# Patient Record
Sex: Female | Born: 2000 | Race: Black or African American | Hispanic: No | Marital: Single | Smoking: Never smoker
Health system: Southern US, Community
[De-identification: ages and names within clinical notes are randomized; demographics above are authoritative.]

## PROBLEM LIST (undated history)

## (undated) DIAGNOSIS — J45909 Unspecified asthma, uncomplicated: Secondary | ICD-10-CM

## (undated) DIAGNOSIS — R569 Unspecified convulsions: Secondary | ICD-10-CM

---

## 2020-07-03 ENCOUNTER — Ambulatory Visit
Admission: EM | Admit: 2020-07-03 | Discharge: 2020-07-03 | Disposition: A | Discharge status: Home / Self Care | Payer: Self-pay

## 2020-07-03 DIAGNOSIS — R0789 Other chest pain: Secondary | ICD-10-CM

## 2020-07-03 HISTORY — DX: Unspecified asthma, uncomplicated: J45.909

## 2020-07-03 MED ORDER — IBUPROFEN 800 MG PO TABS: 800.0000 mg | ORAL_TABLET | Freq: Three times a day (TID) | ORAL | 0 refills | Status: DC

## 2020-07-03 MED ORDER — ALBUTEROL SULFATE HFA 108 (90 BASE) MCG/ACT IN AERS
2.0000 | INHALATION_SPRAY | Freq: Once | RESPIRATORY_TRACT | Status: AC
  Administered 2020-07-03: 2 via RESPIRATORY_TRACT

## 2020-07-03 NOTE — ED Provider Notes (Signed)
[facility]    CSN: [id number] Arrival date & time: 07/03/20  1144      History   Chief Complaint Chief Complaint  Patient presents with  . Asthma    HPI Rebekah Leon is a 19 y.o. female.   19 year old female comes in for acute onset of right arm shaking and feeling of chest tightness/unable to catch her breath. States shaking has since resolved. Continues with chest tightness. Denies wheezing. Denies URI symptoms, fever. Does not have an inhaler     Past Medical History:  Diagnosis Date  . Asthma     There are no problems to display for this patient.   History reviewed. No pertinent surgical history.  OB History   No obstetric history on file.      Home Medications    Prior to Admission medications   Medication Sig Start Date End Date Taking? Authorizing Provider  albuterol (VENTOLIN HFA) 108 (90 Base) MCG/ACT inhaler Inhale into the lungs every 6 (six) hours as needed for wheezing or shortness of breath.   Yes [provider]  ibuprofen (ADVIL) 800 MG tablet Take 1 tablet (800 mg total) by mouth 3 (three) times daily. 07/03/20   Belinda Fisher, PA-C    Family History No family history on file.  Social History Social History   Tobacco Use  . Smoking status: Never Smoker  . Smokeless tobacco: Never Used  Substance Use Topics  . Alcohol use: Never  . Drug use: Never     Allergies   Patient has no known allergies.   Review of Systems Review of Systems  Reason unable to perform ROS: See HPI as above.     Physical Exam Triage Vital Signs ED Triage Vitals  Enc Vitals Group     BP 07/03/20 1157 (!) 143/95     Pulse Rate 07/03/20 1157 82     Resp 07/03/20 1157 18     Temp 07/03/20 1157 97.9 F (36.6 C)     Temp Source 07/03/20 1157 Oral     SpO2 07/03/20 1157 98 %     Weight --      Height --      Head Circumference --      Peak Flow --      Pain Score 07/03/20 1158 8     Pain Loc --      Pain Edu? --      Excl.  in GC? --    No data found.  Updated Vital Signs BP (!) 143/95 (BP Location: Left Arm)   Pulse 82   Temp 97.9 F (36.6 C) (Oral)   Resp 18   LMP 06/14/2020   SpO2 98%   Physical Exam Constitutional:      General: She is not in acute distress.    Appearance: Normal appearance. She is well-developed. She is not toxic-appearing or diaphoretic.  HENT:     Head: Normocephalic and atraumatic.  Eyes:     Conjunctiva/sclera: Conjunctivae normal.     Pupils: Pupils are equal, round, and reactive to light.  Cardiovascular:     Rate and Rhythm: Normal rate and regular rhythm.  Pulmonary:     Effort: Pulmonary effort is normal. No respiratory distress.     Comments: LCTAB Chest:     Comments: Chest tenderness to palpation.  Musculoskeletal:     Cervical back: Normal range of motion and neck supple.  Skin:    General: Skin is warm and dry.  Neurological:     Mental Status: She is alert and oriented to person, place, and time.    UC Treatments / Results  Labs (all labs ordered are listed, but only abnormal results are displayed) Labs Reviewed - No data to display  EKG   Radiology No results found.  Procedures Procedures (including critical care time)  Medications Ordered in UC Medications  albuterol (VENTOLIN HFA) 108 (90 Base) MCG/ACT inhaler 2 puff (2 puffs Inhalation Given 07/03/20 1214)    Initial Impression / Assessment and Plan / UC Course  I have reviewed the triage vital signs and the nursing notes.  Pertinent labs & imaging results that were available during my care of the patient were reviewed by me and considered in my medical decision making (see chart for details).    Chest wall tenderness to palpation. Discussed may not be asthma related. However, given without inhaler, will provide albuterol as needed. NSAIDs. Return precautions given.  Final Clinical Impressions(s) / UC Diagnoses   Final diagnoses:  Chest wall pain   ED Prescriptions     Medication Sig Dispense Auth. Provider   ibuprofen (ADVIL) 800 MG tablet Take 1 tablet (800 mg total) by mouth 3 (three) times daily. 21 tablet Belinda Fisher, PA-C     PDMP not reviewed this encounter.   Belinda Fisher, PA-C 07/03/20 1217

## 2020-07-03 NOTE — ED Triage Notes (Signed)
Pt states was at work and her rt arm started shaking and then she couldn't catch her breath. States didn't have her inhaler with her. Pt speaking in complete sentences, no distress noted.

## 2020-07-03 NOTE — Discharge Instructions (Signed)
Your lungs are clear without wheezing. Your chest wall is tender, which may be causing the symptoms instead of having an asthma attack. Ibuprofen as directed. Albuterol as needed. Otherwise, follow up for recheck if symptoms worsening. Follow up with PCP for further refills of inhaler if needed.

## 2020-08-23 ENCOUNTER — Ambulatory Visit
Admission: EM | Admit: 2020-08-23 | Discharge: 2020-08-23 | Disposition: A | Discharge status: Home / Self Care | Payer: Self-pay | Attending: Emergency Medicine | Admitting: Emergency Medicine

## 2020-08-23 ENCOUNTER — Other Ambulatory Visit: Payer: Self-pay

## 2020-08-23 DIAGNOSIS — R310 Gross hematuria: Secondary | ICD-10-CM

## 2020-08-23 DIAGNOSIS — M545 Low back pain, unspecified: Secondary | ICD-10-CM

## 2020-08-23 LAB — POCT URINALYSIS DIP (MANUAL ENTRY)
Bilirubin, UA: NEGATIVE
Glucose, UA: NEGATIVE mg/dL
Ketones, POC UA: NEGATIVE mg/dL
Leukocytes, UA: NEGATIVE
Nitrite, UA: NEGATIVE
Protein Ur, POC: 100 mg/dL — AB
Spec Grav, UA: >=1.03 — AB (ref 1.010–1.025)
Urobilinogen, UA: 0.2 E.U./dL
pH, UA: 6 (ref 5.0–8.0)

## 2020-08-23 LAB — POCT URINE PREGNANCY: Preg Test, Ur: NEGATIVE

## 2020-08-23 MED ORDER — CYCLOBENZAPRINE HCL 5 MG PO TABS
5.0000 mg | ORAL_TABLET | Freq: Two times a day (BID) | ORAL | 0 refills | Status: DC | PRN
Start: 2020-08-23 — End: 2020-11-07

## 2020-08-23 MED ORDER — TAMSULOSIN HCL 0.4 MG PO CAPS: 0.4000 mg | ORAL_CAPSULE | Freq: Every day | ORAL | 0 refills | Status: AC

## 2020-08-23 MED ORDER — NAPROXEN 500 MG PO TABS: 500.0000 mg | ORAL_TABLET | Freq: Two times a day (BID) | ORAL | 0 refills | Status: DC

## 2020-08-23 NOTE — ED Provider Notes (Addendum)
[facility]    CSN: [id number] Arrival date & time: 08/23/20  1121      History   Chief Complaint Chief Complaint  Patient presents with  . Flank Pain    Primarily left since monday    HPI Rebekah Leon is a 19 y.o. female history of asthma presenting today for evaluation of left flank pain.  Patient reports that symptoms will occasionally radiate to the right side.  She denies any urinary symptoms of dysuria frequency urgency, but has noticed some hematuria couple days ago.  Denies any injury or trauma to back.  Pain has been constant, but waxes and wanes in severity.  Denies associated nausea or vomiting.  Denies any history of kidney stones.  HPI  Past Medical History:  Diagnosis Date  . Asthma     There are no problems to display for this patient.   History reviewed. No pertinent surgical history.  OB History   No obstetric history on file.      Home Medications    Prior to Admission medications   Medication Sig Start Date End Date Taking? Authorizing Provider  albuterol (VENTOLIN HFA) 108 (90 Base) MCG/ACT inhaler Inhale into the lungs every 6 (six) hours as needed for wheezing or shortness of breath.    [provider]  cyclobenzaprine (FLEXERIL) 5 MG tablet Take 1-2 tablets (5-10 mg total) by mouth 2 (two) times daily as needed for muscle spasms. 08/23/20   Johnathan Heskett C, PA-C  ibuprofen (ADVIL) 800 MG tablet Take 1 tablet (800 mg total) by mouth 3 (three) times daily. 07/03/20   Cathie Hoops, Amy V, PA-C  naproxen (NAPROSYN) 500 MG tablet Take 1 tablet (500 mg total) by mouth 2 (two) times daily. 08/23/20   Ashon Rosenberg C, PA-C  tamsulosin (FLOMAX) 0.4 MG CAPS capsule Take 1 capsule (0.4 mg total) by mouth daily. 08/23/20   Ehab Humber, Junius Creamer, PA-C    Family History History reviewed. No pertinent family history.  Social History Social History   Tobacco Use  . Smoking status: Never Smoker  . Smokeless tobacco: Never Used  Vaping Use   . Vaping Use: Never used  Substance Use Topics  . Alcohol use: Never  . Drug use: Never     Allergies   Patient has no known allergies.   Review of Systems Review of Systems  Constitutional: Negative for fever.  Respiratory: Negative for shortness of breath.   Cardiovascular: Negative for chest pain.  Gastrointestinal: Negative for abdominal pain, diarrhea, nausea and vomiting.  Genitourinary: Positive for flank pain and hematuria. Negative for dysuria, genital sores, menstrual problem, vaginal bleeding, vaginal discharge and vaginal pain.  Musculoskeletal: Positive for back pain and myalgias.  Skin: Negative for rash.  Neurological: Negative for dizziness, light-headedness and headaches.     Physical Exam Triage Vital Signs ED Triage Vitals  Enc Vitals Group     BP 08/23/20 1155 (!) 135/93     Pulse Rate 08/23/20 1155 77     Resp 08/23/20 1155 18     Temp 08/23/20 1155 97.8 F (36.6 C)     Temp Source 08/23/20 1155 Oral     SpO2 08/23/20 1155 98 %     Weight --      Height --      Head Circumference --      Peak Flow --      Pain Score 08/23/20 1215 8     Pain Loc --      Pain Edu? --  Excl. in GC? --    No data found.  Updated Vital Signs BP (!) 135/93 (BP Location: Left Arm)   Pulse 77   Temp 97.8 F (36.6 C) (Oral)   Resp 18   LMP 08/11/2020   SpO2 98%   Visual Acuity Right Eye Distance:   Left Eye Distance:   Bilateral Distance:    Right Eye Near:   Left Eye Near:    Bilateral Near:     Physical Exam Vitals and nursing note reviewed.  Constitutional:      Appearance: She is well-developed and well-nourished.     Comments: No acute distress  HENT:     Head: Normocephalic and atraumatic.     Nose: Nose normal.  Eyes:     Conjunctiva/sclera: Conjunctivae normal.  Cardiovascular:     Rate and Rhythm: Normal rate.  Pulmonary:     Effort: Pulmonary effort is normal. No respiratory distress.     Comments: Breathing comfortably at rest,  CTABL, no wheezing, rales or other adventitious sounds auscultated Abdominal:     General: There is no distension.     Comments: Soft, nondistended, nontender light to palpation throughout abdomen  Musculoskeletal:        General: Normal range of motion.     Cervical back: Neck supple.     Comments: Back: Nontender to palpation of cervical thoracic and lumbar spine midline, diffuse tenderness throughout left lumbar and lower thoracic area extending to right flank/mid axillary line  Skin:    General: Skin is warm and dry.  Neurological:     Mental Status: She is alert and oriented to person, place, and time.  Psychiatric:        Mood and Affect: Mood and affect normal.      UC Treatments / Results  Labs (all labs ordered are listed, but only abnormal results are displayed) Labs Reviewed  POCT URINALYSIS DIP (MANUAL ENTRY) - Abnormal; Notable for the following components:      Result Value   Spec Grav, UA >=1.030 (*)    Blood, UA small (*)    Protein Ur, POC =100 (*)    All other components within normal limits  POCT URINE PREGNANCY    EKG   Radiology No results found.  Procedures Procedures (including critical care time)  Medications Ordered in UC Medications - No data to display  Initial Impression / Assessment and Plan / UC Course  I have reviewed the triage vital signs and the nursing notes.  Pertinent labs & imaging results that were available during my care of the patient were reviewed by me and considered in my medical decision making (see chart for details).     Negative pregnancy test, UA without leuks and nitrites, does have small amount of hemoglobin, unclear if this is related to being concentrated versus true hematuria.  Reported gross hematuria couple days ago, cannot completely rule out underlying stone contributing to symptoms.  Did have reproducible tenderness to touch, with this more suggestive of underlying MSK etiology of back pain.  No mechanism of  injury.  Recommending anti-inflammatories muscle relaxers, Flomax to help with any possible underlying stone with close monitoring of symptoms.  Discussed strict return precautions. Patient verbalized understanding and is agreeable with plan.  Final Clinical Impressions(s) / UC Diagnoses   Final diagnoses:  Acute left-sided low back pain without sciatica  Gross hematuria     Discharge Instructions     Naprosyn twice daily for back pain May supplement Flexeril  as needed at home and bedtime- this is a muscle relaxer Begin tamsulosin daily to help pass any possible underlying stone contributing to blood in urine Please follow-up if any symptoms not improving or worsening, if you are developing worsening pain with vomiting, fevers please go to emergency room for further evaluation    ED Prescriptions    Medication Sig Dispense Auth. Provider   naproxen (NAPROSYN) 500 MG tablet Take 1 tablet (500 mg total) by mouth 2 (two) times daily. 30 tablet Samer Dutton C, PA-C   cyclobenzaprine (FLEXERIL) 5 MG tablet Take 1-2 tablets (5-10 mg total) by mouth 2 (two) times daily as needed for muscle spasms. 24 tablet Herb Beltre C, PA-C   tamsulosin (FLOMAX) 0.4 MG CAPS capsule Take 1 capsule (0.4 mg total) by mouth daily. 7 capsule Aracelie Addis, [place] C, PA-C     PDMP not reviewed this encounter.   Sharyon Cable, [place] C, PA-C 08/23/20 1329    Hattie Pine, Ida C, PA-C 08/23/20 1332

## 2020-08-23 NOTE — ED Triage Notes (Signed)
Patient states she has had left flank pain primarily with occasional radiation to the right side. Pt states she has no burning when urinating or frequency/urgency but passed a large clot while urinating on Monday. Pt is aox4 and ambulatory.

## 2020-08-23 NOTE — Discharge Instructions (Signed)
Naprosyn twice daily for back pain May supplement Flexeril as needed at home and bedtime- this is a muscle relaxer Begin tamsulosin daily to help pass any possible underlying stone contributing to blood in urine Please follow-up if any symptoms not improving or worsening, if you are developing worsening pain with vomiting, fevers please go to emergency room for further evaluation

## 2020-11-07 ENCOUNTER — Ambulatory Visit
Admission: EM | Admit: 2020-11-07 | Discharge: 2020-11-07 | Disposition: A | Discharge status: Home / Self Care | Payer: Self-pay | Attending: Family Medicine | Admitting: Family Medicine

## 2020-11-07 ENCOUNTER — Other Ambulatory Visit: Payer: Self-pay

## 2020-11-07 DIAGNOSIS — R519 Headache, unspecified: Secondary | ICD-10-CM | POA: Insufficient documentation

## 2020-11-07 DIAGNOSIS — E860 Dehydration: Secondary | ICD-10-CM | POA: Insufficient documentation

## 2020-11-07 DIAGNOSIS — N3000 Acute cystitis without hematuria: Secondary | ICD-10-CM | POA: Insufficient documentation

## 2020-11-07 LAB — POCT URINALYSIS DIP (MANUAL ENTRY)
Bilirubin, UA: NEGATIVE
Blood, UA: NEGATIVE
Glucose, UA: NEGATIVE mg/dL
Nitrite, UA: NEGATIVE
Protein Ur, POC: 100 mg/dL — AB
Spec Grav, UA: >=1.03 — AB (ref 1.010–1.025)
Urobilinogen, UA: 0.2 E.U./dL
pH, UA: 7 (ref 5.0–8.0)

## 2020-11-07 LAB — POCT URINE PREGNANCY: Preg Test, Ur: NEGATIVE

## 2020-11-07 MED ORDER — NITROFURANTOIN MONOHYD MACRO 100 MG PO CAPS: 100.0000 mg | ORAL_CAPSULE | Freq: Two times a day (BID) | ORAL | 0 refills | Status: AC

## 2020-11-07 MED ORDER — NAPROXEN 500 MG PO TABS: 500.0000 mg | ORAL_TABLET | Freq: Two times a day (BID) | ORAL | 0 refills | Status: DC

## 2020-11-07 MED ORDER — CYCLOBENZAPRINE HCL 10 MG PO TABS: 10.0000 mg | ORAL_TABLET | Freq: Two times a day (BID) | ORAL | 0 refills | Status: AC | PRN

## 2020-11-07 NOTE — ED Provider Notes (Signed)
[facility]    CSN: [id number] Arrival date & time: 11/07/20  1601      History   Chief Complaint Chief Complaint  Patient presents with  . Headache    HPI Rebekah Leon is a 20 y.o. female.   HPI  Patient presents today with headaches and dizziness and weakness for over 2 weeks.  Patient endorses intermittent headaches.  She denies any nasal congestion or inner ear pressure.  She has not taken anything for headache related pain.  Endorses poor water intake.  She also endorses some left flank pain that has been intermittently for over a month.  She was diagnosed with a possible kidney stone back in December and reports pain has remained consistent since December she denies any visible hematuria.  Endorses some dysuria.Denies nausea, vomitus, or abdominal pain. Past Medical History:  Diagnosis Date  . Asthma     There are no problems to display for this patient.   History reviewed. No pertinent surgical history.  OB History   No obstetric history on file.      Home Medications    Prior to Admission medications   Medication Sig Start Date End Date Taking? Authorizing Provider  albuterol (VENTOLIN HFA) 108 (90 Base) MCG/ACT inhaler Inhale into the lungs every 6 (six) hours as needed for wheezing or shortness of breath.    [provider]  ibuprofen (ADVIL) 800 MG tablet Take 1 tablet (800 mg total) by mouth 3 (three) times daily. 07/03/20   Cathie Hoops, Amy V, PA-C  naproxen (NAPROSYN) 500 MG tablet Take 1 tablet (500 mg total) by mouth 2 (two) times daily. 08/23/20   Wieters, Hallie C, PA-C  tamsulosin (FLOMAX) 0.4 MG CAPS capsule Take 1 capsule (0.4 mg total) by mouth daily. 08/23/20   Wieters, Junius Creamer, PA-C    Family History History reviewed. No pertinent family history.  Social History Social History   Tobacco Use  . Smoking status: Never Smoker  . Smokeless tobacco: Never Used  Vaping Use  . Vaping Use: Never used  Substance Use Topics  .  Alcohol use: Never  . Drug use: Never     Allergies   Patient has no known allergies.   Review of Systems Review of Systems Pertinent negatives listed in HPI  Physical Exam Triage Vital Signs ED Triage Vitals  Enc Vitals Group     BP 11/07/20 1639 127/83     Pulse Rate 11/07/20 1639 78     Resp 11/07/20 1639 20     Temp 11/07/20 1639 98.9 F (37.2 C)     Temp Source 11/07/20 1639 Oral     SpO2 11/07/20 1639 98 %     Weight --      Height --      Head Circumference --      Peak Flow --      Pain Score 11/07/20 1648 9     Pain Loc --      Pain Edu? --      Excl. in GC? --    No data found.  Updated Vital Signs BP 127/83 (BP Location: Left Arm)   Pulse 78   Temp 98.9 F (37.2 C) (Oral)   Resp 20   LMP 10/15/2020   SpO2 98%   Visual Acuity Right Eye Distance:   Left Eye Distance:   Bilateral Distance:    Right Eye Near:   Left Eye Near:    Bilateral Near:     Physical Exam  General appearance: alert, well developed, well nourished, cooperative  Head: Normocephalic, without obvious abnormality, atraumatic Respiratory: Respirations even and unlabored, normal respiratory rate Heart: rate and rhythm normal. No gallop or murmurs noted on exam  Abdomen: BS +, no distention, no rebound tenderness, or no mass Extremities: No gross deformities Skin: Skin color, texture, turgor normal. No rashes seen  Psych: Appropriate mood and affect. Neurologic: GCS 15, normal gait, normal coordination  UC Treatments / Results  Labs (all labs ordered are listed, but only abnormal results are displayed) Labs Reviewed  POCT URINALYSIS DIP (MANUAL ENTRY) - Abnormal; Notable for the following components:      Result Value   Clarity, UA hazy (*)    Ketones, POC UA trace (5) (*)    Spec Grav, UA >=1.030 (*)    Protein Ur, POC =100 (*)    Leukocytes, UA Trace (*)    All other components within normal limits  URINE CULTURE  POCT URINE PREGNANCY    EKG   Radiology No  results found.  Procedures Procedures (including critical care time)  Medications Ordered in UC Medications - No data to display  Initial Impression / Assessment and Plan / UC Course  I have reviewed the triage vital signs and the nursing notes.  Pertinent labs & imaging results that were available during my care of the patient were reviewed by me and considered in my medical decision making (see chart for details).  Clinical Course as of 11/10/20 1015  Sat Nov 10, 2020  1013 Urine culture [KH]    Clinical Course User Index [KH] Bing Neighbors, FNP    Treating for acute cystitis based on UA findings and clinical symptoms with nitrofurantoin urine culture pending.  For acute ongoing flank pain and headache related pain trial cyclobenzaprine twice daily as needed along with naproxen 500 mg twice daily as needed.  Encouraged to hydrate well with water as this is likely resulting in dizziness and acute headache pain.  Return precautions given. Final Clinical Impressions(s) / UC Diagnoses   Final diagnoses:  Acute cystitis without hematuria  Mild dehydration  Generalized headache   Discharge Instructions   None    ED Prescriptions    Medication Sig Dispense Auth. Provider   cyclobenzaprine (FLEXERIL) 10 MG tablet Take 1 tablet (10 mg total) by mouth 2 (two) times daily as needed for muscle spasms. 20 tablet Bing Neighbors, FNP   nitrofurantoin, macrocrystal-monohydrate, (MACROBID) 100 MG capsule Take 1 capsule (100 mg total) by mouth 2 (two) times daily for 5 days. 10 capsule Bing Neighbors, FNP   naproxen (NAPROSYN) 500 MG tablet Take 1 tablet (500 mg total) by mouth 2 (two) times daily. 30 tablet Bing Neighbors, FNP     PDMP not reviewed this encounter.   Bing Neighbors, FNP 11/10/20 1015

## 2020-11-07 NOTE — ED Triage Notes (Signed)
Pt c/o headaches with dizziness and weakness for over 2wks. Hx of headaches. Pt c/o lt flank pain for months, states was tx'd for kidney stones but pain is still there.

## 2020-11-10 LAB — URINE CULTURE
Culture: <10000 — AB
Special Requests: NORMAL

## 2021-01-08 ENCOUNTER — Other Ambulatory Visit: Payer: Self-pay

## 2021-01-08 ENCOUNTER — Emergency Department (HOSPITAL_COMMUNITY): Payer: No Typology Code available for payment source

## 2021-01-08 ENCOUNTER — Encounter (HOSPITAL_COMMUNITY): Payer: Self-pay

## 2021-01-08 ENCOUNTER — Emergency Department (HOSPITAL_COMMUNITY)
Admission: EM | Admit: 2021-01-08 | Discharge: 2021-01-08 | Disposition: A | Discharge status: Home / Self Care | Payer: No Typology Code available for payment source | Attending: Emergency Medicine | Admitting: Emergency Medicine

## 2021-01-08 DIAGNOSIS — J45909 Unspecified asthma, uncomplicated: Secondary | ICD-10-CM | POA: Diagnosis not present

## 2021-01-08 DIAGNOSIS — M25511 Pain in right shoulder: Secondary | ICD-10-CM

## 2021-01-08 DIAGNOSIS — Y990 Civilian activity done for income or pay: Secondary | ICD-10-CM | POA: Insufficient documentation

## 2021-01-08 DIAGNOSIS — S4991XA Unspecified injury of right shoulder and upper arm, initial encounter: Secondary | ICD-10-CM | POA: Diagnosis present

## 2021-01-08 DIAGNOSIS — W19XXXA Unspecified fall, initial encounter: Secondary | ICD-10-CM | POA: Diagnosis not present

## 2021-01-08 NOTE — ED Notes (Signed)
Written discharge instructions given to the patient and reviewed

## 2021-01-08 NOTE — ED Provider Notes (Signed)
Emergency Medicine Provider Triage Evaluation Note  Rebekah Leon 20 y.o. female was evaluated in triage.  Pt complains of right shoulder pain. She was at work when a box fell and landed on her shoulder. Pain with movement. She reports some mild numbness/tingling to her hand.    Review of Systems  Positive: Shoulder pain, numbness.  Negative: Weakness.   Physical Exam  BP 134/82   Pulse 70   Temp 98.2 F (36.8 C) (Oral)   Resp 18   Ht 5\' 4"  (1.626 m)   Wt 65.8 kg   SpO2 100%   BMI 24.89 kg/m  Gen:   Awake, no distress   HEENT:  Atraumatic  Resp:  Normal effort  Cardiac:  Normal rate. 2+ radial pulses bilaterally.   MSK:   Moves extremities without difficulty. Right shoulder in sling. Tenderness noted to the right shoulder. No deformity or crepitus.  Skin:   Good distal cap refill. RUE is not dusky in appearance or cool to touch. Neuro:  Speech clear   Medical Decision Making  Medically screening exam initiated at 3:55 AM.  Appropriate orders placed.  Rebekah Leon was informed that the remainder of the evaluation will be completed by another provider, this initial triage assessment does not replace that evaluation, and the importance of remaining in the ED until their evaluation is complete.  Clinical Impression  Shoulder pain   Portions of this note were generated with Dragon dictation software. Dictation errors may occur despite best attempts at proofreading.     , PA-C 01/08/21 1333    03/10/21, MD 01/09/21 [phone]

## 2021-01-08 NOTE — ED Triage Notes (Signed)
Pt presents with c/o right shoulder injury after a box fell on her shoulder at work around 5:30 this morning.

## 2021-01-08 NOTE — ED Provider Notes (Signed)
[facility]-EMERGENCY DEPT Provider Note   CSN: [id number] Arrival date & time: 01/08/21  1319     History Chief Complaint  Patient presents with  . Shoulder Injury    Rebekah Leon is a 20 y.o. female.  Rebekah Hatton is left-handed.  She works at [employer].  A box fell on the top of her right shoulder, and she sustained pain.  Pain is worse with movement of the right arm.  The history is provided by the patient.  Shoulder Injury This is a new problem. The current episode started 12 to 24 hours ago. The problem occurs constantly. The problem has not changed since onset.Pertinent negatives include no chest pain, no abdominal pain, no headaches and no shortness of breath. Exacerbated by: movement of arm. The symptoms are relieved by rest. Treatments tried: sling. The treatment provided mild relief.       Past Medical History:  Diagnosis Date  . Asthma     There are no problems to display for this patient.   History reviewed. No pertinent surgical history.   OB History   No obstetric history on file.     History reviewed. No pertinent family history.  Social History   Tobacco Use  . Smoking status: Never Smoker  . Smokeless tobacco: Never Used  Vaping Use  . Vaping Use: Never used  Substance Use Topics  . Alcohol use: Never  . Drug use: Never    Home Medications Prior to Admission medications   Medication Sig Start Date End Date Taking? Authorizing Provider  albuterol (VENTOLIN HFA) 108 (90 Base) MCG/ACT inhaler Inhale into the lungs every 6 (six) hours as needed for wheezing or shortness of breath.    [provider]  cyclobenzaprine (FLEXERIL) 10 MG tablet Take 1 tablet (10 mg total) by mouth 2 (two) times daily as needed for muscle spasms. 11/07/20   Bing Neighbors, FNP  ibuprofen (ADVIL) 800 MG tablet Take 1 tablet (800 mg total) by mouth 3 (three) times daily. 07/03/20   Cathie Hoops, Amy V, PA-C  naproxen (NAPROSYN) 500 MG  tablet Take 1 tablet (500 mg total) by mouth 2 (two) times daily. 11/07/20   Bing Neighbors, FNP  tamsulosin (FLOMAX) 0.4 MG CAPS capsule Take 1 capsule (0.4 mg total) by mouth daily. 08/23/20   Wieters, Hallie C, PA-C    Allergies    Patient has no known allergies.  Review of Systems   Review of Systems  Constitutional: Negative for chills and fever.  HENT: Negative for ear pain and sore throat.   Eyes: Negative for pain and visual disturbance.  Respiratory: Negative for cough and shortness of breath.   Cardiovascular: Negative for chest pain and palpitations.  Gastrointestinal: Negative for abdominal pain and vomiting.  Genitourinary: Negative for dysuria and hematuria.  Musculoskeletal: Negative for arthralgias and back pain.  Skin: Negative for color change and rash.  Neurological: Negative for seizures, syncope and headaches.  All other systems reviewed and are negative.   Physical Exam Updated Vital Signs BP (!) 151/93 (BP Location: Left Arm)   Pulse 79   Temp 99.1 F (37.3 C) (Oral)   Resp 16   LMP 01/08/2021 (Approximate)   SpO2 99%   Physical Exam Vitals and nursing note reviewed.  HENT:     Head: Normocephalic and atraumatic.  Eyes:     General: No scleral icterus. Pulmonary:     Effort: Pulmonary effort is normal. No respiratory distress.  Musculoskeletal:  Cervical back: Normal range of motion.     Comments: The right shoulder is normal to inspection.  No tenderness over the clavicle or sternum.  There is mild, diffuse tenderness over the right shoulder.  Active range of motion is limited secondary to pain.  Passive range of motion is to 90 degrees forward flexion and abduction.  The arm is warm and well-perfused.  Skin:    General: Skin is warm and dry.  Neurological:     Mental Status: She is alert.  Psychiatric:        Mood and Affect: Mood normal.     ED Results / Procedures / Treatments   Labs (all labs ordered are listed, but only abnormal  results are displayed) Labs Reviewed - No data to display  EKG None  Radiology DG Shoulder Right  Result Date: 01/08/2021 CLINICAL DATA:  shoulder pain EXAM: RIGHT SHOULDER - 2+ VIEW COMPARISON:  None. FINDINGS: There is no evidence of fracture or dislocation. There is no evidence of arthropathy or other focal bone abnormality. Soft tissues are unremarkable. IMPRESSION: Negative right shoulder radiographs Electronically Signed   By: Caprice Renshaw   On: 01/08/2021 14:35    Procedures Procedures   Medications Ordered in ED Medications - No data to display  ED Course  I have reviewed the triage vital signs and the nursing notes.  Pertinent labs & imaging results that were available during my care of the patient were reviewed by me and considered in my medical decision making (see chart for details).    MDM Rules/Calculators/A&P                          Rebekah Rease likely sustained a shoulder contusion, subluxation, or soft tissue injury.  She can continue symptomatic treatment and she will follow-up with her Worker's Comp. provider.  She was given instructions on symptomatic management as well as a work note. Final Clinical Impression(s) / ED Diagnoses Final diagnoses:  Acute pain of right shoulder    Rx / DC Orders ED Discharge Orders    None       Koleen Distance, MD 01/08/21 [phone]

## 2021-01-10 ENCOUNTER — Emergency Department (HOSPITAL_COMMUNITY): Payer: BC Managed Care – PPO

## 2021-01-10 ENCOUNTER — Other Ambulatory Visit: Payer: Self-pay

## 2021-01-10 ENCOUNTER — Encounter (HOSPITAL_COMMUNITY): Payer: Self-pay

## 2021-01-10 ENCOUNTER — Emergency Department (HOSPITAL_COMMUNITY)
Admission: EM | Admit: 2021-01-10 | Discharge: 2021-01-10 | Disposition: A | Discharge status: Home / Self Care | Payer: BC Managed Care – PPO | Attending: Emergency Medicine | Admitting: Emergency Medicine

## 2021-01-10 DIAGNOSIS — R42 Dizziness and giddiness: Secondary | ICD-10-CM | POA: Insufficient documentation

## 2021-01-10 DIAGNOSIS — R55 Syncope and collapse: Secondary | ICD-10-CM | POA: Diagnosis not present

## 2021-01-10 DIAGNOSIS — R030 Elevated blood-pressure reading, without diagnosis of hypertension: Secondary | ICD-10-CM | POA: Diagnosis not present

## 2021-01-10 DIAGNOSIS — J45909 Unspecified asthma, uncomplicated: Secondary | ICD-10-CM | POA: Diagnosis not present

## 2021-01-10 DIAGNOSIS — R519 Headache, unspecified: Secondary | ICD-10-CM | POA: Diagnosis present

## 2021-01-10 DIAGNOSIS — H53149 Visual discomfort, unspecified: Secondary | ICD-10-CM | POA: Diagnosis not present

## 2021-01-10 DIAGNOSIS — R203 Hyperesthesia: Secondary | ICD-10-CM | POA: Insufficient documentation

## 2021-01-10 HISTORY — DX: Unspecified convulsions: R56.9

## 2021-01-10 LAB — CBC WITH DIFFERENTIAL/PLATELET
Abs Immature Granulocytes: NONE SEEN 10*3/uL (ref 0.00–0.07)
Band Neutrophils: 0 %
Basophils Relative: 0 %
Blasts: NONE SEEN %
Eosinophils Relative: 1 %
HCT: 43.4 % (ref 36.0–46.0)
Hemoglobin: 13.3 g/dL (ref 12.0–15.0)
Immature Granulocytes: NONE SEEN %
Lymphocytes Relative: 13 %
MCH: 26.8 pg (ref 26.0–34.0)
MCHC: 30.6 g/dL (ref 30.0–36.0)
MCV: 87.5 fL (ref 80.0–100.0)
Metamyelocytes Relative: NONE SEEN %
Monocytes Relative: 1 %
Myelocytes: NONE SEEN %
Neutrophils Relative %: 85 %
Platelets: 298 10*3/uL (ref 150–400)
Promyelocytes Relative: NONE SEEN %
RBC Morphology: NORMAL
RBC: 4.96 MIL/uL (ref 3.87–5.11)
RDW: 14 % (ref 11.5–15.5)
WBC Morphology: REACTIVE
WBC: 12 10*3/uL — ABNORMAL HIGH (ref 4.0–10.5)
nRBC: 0 % (ref 0.0–0.2)
nRBC: NONE SEEN /100 WBC

## 2021-01-10 LAB — BASIC METABOLIC PANEL
Anion gap: 7 (ref 5–15)
BUN: 11 mg/dL (ref 6–20)
CO2: 25 mmol/L (ref 22–32)
Calcium: 9.4 mg/dL (ref 8.9–10.3)
Chloride: 105 mmol/L (ref 98–111)
Creatinine, Ser: 0.9 mg/dL (ref 0.44–1.00)
GFR, Estimated: >60 mL/min (ref >60)
Glucose, Bld: 100 mg/dL — ABNORMAL HIGH (ref 70–99)
Potassium: 4 mmol/L (ref 3.5–5.1)
Sodium: 137 mmol/L (ref 135–145)

## 2021-01-10 LAB — I-STAT BETA HCG BLOOD, ED (MC, WL, AP ONLY): I-stat hCG, quantitative: <5 m[IU]/mL (ref <5)

## 2021-01-10 LAB — CBG MONITORING, ED
Glucose-Capillary: 93 mg/dL (ref 70–99)
Glucose-Capillary: 71 mg/dL (ref 70–99)

## 2021-01-10 LAB — RAPID URINE DRUG SCREEN, HOSP PERFORMED
Amphetamines: NOT DETECTED
Barbiturates: NOT DETECTED
Benzodiazepines: NOT DETECTED
Cocaine: NOT DETECTED
Opiates: NOT DETECTED
Tetrahydrocannabinol: NOT DETECTED

## 2021-01-10 MED ORDER — IOHEXOL 350 MG/ML SOLN
80.0000 mL | Freq: Once | INTRAVENOUS | Status: AC | PRN
  Administered 2021-01-10: 80 mL via INTRAVENOUS

## 2021-01-10 MED ORDER — KETOROLAC TROMETHAMINE 30 MG/ML IJ SOLN
15.0000 mg | Freq: Once | INTRAMUSCULAR | Status: AC
  Administered 2021-01-10: 15 mg via INTRAVENOUS
  Filled 2021-01-10: qty 1

## 2021-01-10 MED ORDER — METOCLOPRAMIDE HCL 5 MG/ML IJ SOLN
5.0000 mg | Freq: Once | INTRAMUSCULAR | Status: AC
  Administered 2021-01-10: 5 mg via INTRAVENOUS
  Filled 2021-01-10: qty 2

## 2021-01-10 MED ORDER — SODIUM CHLORIDE 0.9 % IV BOLUS
1000.0000 mL | Freq: Once | INTRAVENOUS | Status: AC
  Administered 2021-01-10: 1000 mL via INTRAVENOUS

## 2021-01-10 MED ORDER — DIPHENHYDRAMINE HCL 50 MG/ML IJ SOLN: 50.0000 mg | Freq: Once | INTRAMUSCULAR | Status: DC

## 2021-01-10 MED ORDER — NAPROXEN 375 MG PO TABS: 375.0000 mg | ORAL_TABLET | Freq: Two times a day (BID) | ORAL | 0 refills | Status: DC

## 2021-01-10 MED ORDER — MECLIZINE HCL 25 MG PO TABS: 25.0000 mg | ORAL_TABLET | Freq: Three times a day (TID) | ORAL | 0 refills | Status: AC | PRN

## 2021-01-10 MED ORDER — DIPHENHYDRAMINE HCL 50 MG/ML IJ SOLN
25.0000 mg | Freq: Once | INTRAMUSCULAR | Status: AC
  Administered 2021-01-10: 25 mg via INTRAVENOUS
  Filled 2021-01-10: qty 1

## 2021-01-10 MED ORDER — METOCLOPRAMIDE HCL 5 MG/ML IJ SOLN: 10.0000 mg | Freq: Once | INTRAMUSCULAR | Status: DC

## 2021-01-10 NOTE — ED Notes (Signed)
Lavender top tube sent to lab. ?

## 2021-01-10 NOTE — ED Notes (Signed)
Pt attempted to do orthostatic vitals only was able to complete lying and sitting couldn't do the standing pt felt to weak to continue

## 2021-01-10 NOTE — ED Provider Notes (Signed)
Emergency Medicine Provider Triage Evaluation Note  Rebekah Leon , a 20 y.o. female  was evaluated in triage.  Pt complains of feeling dizzy and falling.  Occurred one hour prior.  Patient denies any syncope. Dizziness is worse with standing.    Patient also complains of headache, reports that headache started prior to her fall.  Pain has gotten progressively worse.  Headache is worse with light and sound. Has a history of migraine headaches.    Review of Systems  Positive: Headaches, dizziness, photophobia  Negative: Fever, chills, facial asymmetry, slurred speech, shob, chest pain, neck pain, back pain, syncope, saddle anesthesia, bowel or bladder dysfuction  Physical Exam  BP (!) 143/103 (BP Location: Left Arm)   Pulse 86   Temp 98.8 F (37.1 C) (Oral)   Resp 18   Ht 5\' 7"  (1.702 m)   Wt 104.3 kg   LMP 01/08/2021 (Approximate)   SpO2 98%   BMI 36.02 kg/m  Gen:   Awake, no distress   Resp:  Normal effort  MSK:   Moves extremities without difficulty  Other:  No facial asymmetry, strength equal  Medical Decision Making  Medically screening exam initiated at 2:28 PM.  Appropriate orders placed.  Rebekah Leon was informed that the remainder of the evaluation will be completed by another provider, this initial triage assessment does not replace that evaluation, and the importance of remaining in the ED until their evaluation is complete.  The patient appears stable so that the remainder of the work up may be completed by another provider.      03/10/2021, PA-C 01/10/21 1433    03/12/21, MD 01/12/21 [phone]

## 2021-01-10 NOTE — ED Triage Notes (Addendum)
Per EMS- Patient was walking from her bedroom to the mother's bedroom, felt dizzy and fell. Mom reports that the patient has not have any food or drink since yesterday. Mom also reported that the patient may have taken drugs.   Patient states she did not pass out.

## 2021-01-10 NOTE — ED Provider Notes (Signed)
[facility]-EMERGENCY DEPT Provider Note   CSN: [id number] Arrival date & time: 01/10/21  1348     History Chief Complaint  Patient presents with  . Fall  . Dizziness  . Headache    Rebekah Leon is a 20 y.o. female who presents emergency department with chief complaint of headache and near syncope.  Patient states that at 12:30 PM today she had sudden onset of severe frontal headache, the worst headache she is ever had reaching peak capacity within the first 10 minutes.  She got very dizzy and felt like she was going to pass out and called for her mother.  She complains of hyperesthesia on the left side of her body and has associated photophobia and phonophobia without nausea or vomiting.  She denies racing or skipping in her heart.  She did not drink alcohol last night.  She denies any drug use.  She denies room spinning dizziness.  HPI     Past Medical History:  Diagnosis Date  . Asthma   . Seizures (HCC)     There are no problems to display for this patient.   History reviewed. No pertinent surgical history.   OB History   No obstetric history on file.     Family History  Family history unknown: Yes    Social History   Tobacco Use  . Smoking status: Never Smoker  . Smokeless tobacco: Never Used  Vaping Use  . Vaping Use: Never used  Substance Use Topics  . Alcohol use: Never  . Drug use: Never    Home Medications Prior to Admission medications   Medication Sig Start Date End Date Taking? Authorizing Provider  albuterol (VENTOLIN HFA) 108 (90 Base) MCG/ACT inhaler Inhale into the lungs every 6 (six) hours as needed for wheezing or shortness of breath.    [provider]  cyclobenzaprine (FLEXERIL) 10 MG tablet Take 1 tablet (10 mg total) by mouth 2 (two) times daily as needed for muscle spasms. 11/07/20   Bing Neighbors, FNP  ibuprofen (ADVIL) 800 MG tablet Take 1 tablet (800 mg total) by mouth 3 (three) times daily.  07/03/20   Cathie Hoops, Amy V, PA-C  naproxen (NAPROSYN) 500 MG tablet Take 1 tablet (500 mg total) by mouth 2 (two) times daily. 11/07/20   Bing Neighbors, FNP  tamsulosin (FLOMAX) 0.4 MG CAPS capsule Take 1 capsule (0.4 mg total) by mouth daily. 08/23/20   Wieters, Hallie C, PA-C    Allergies    Cheese  Review of Systems   Review of Systems Ten systems reviewed and are negative for acute change, except as noted in the HPI.   Physical Exam Updated Vital Signs BP (!) 138/101   Pulse 68   Temp 98.8 F (37.1 C) (Oral)   Resp 18   Ht 5\' 7"  (1.702 m)   Wt 104.3 kg   LMP 01/08/2021 (Approximate)   SpO2 100%   BMI 36.02 kg/m   Physical Exam Vitals and nursing note reviewed.  Constitutional:      General: She is not in acute distress.    Appearance: She is well-developed. She is not diaphoretic.  HENT:     Head: Normocephalic and atraumatic.  Eyes:     General: No scleral icterus.    Conjunctiva/sclera: Conjunctivae normal.  Cardiovascular:     Rate and Rhythm: Normal rate and regular rhythm.     Heart sounds: Normal heart sounds. No murmur heard. No friction rub. No gallop.  Pulmonary:     Effort: Pulmonary effort is normal. No respiratory distress.     Breath sounds: Normal breath sounds.  Abdominal:     General: Bowel sounds are normal. There is no distension.     Palpations: Abdomen is soft. There is no mass.     Tenderness: There is no abdominal tenderness. There is no guarding.  Musculoskeletal:     Cervical back: Normal range of motion.  Skin:    General: Skin is warm and dry.  Neurological:     Mental Status: She is alert and oriented to person, place, and time.  Psychiatric:        Behavior: Behavior normal.     ED Results / Procedures / Treatments   Labs (all labs ordered are listed, but only abnormal results are displayed) Labs Reviewed  BASIC METABOLIC PANEL - Abnormal; Notable for the following components:      Result Value   Glucose, Bld 100 (*)     All other components within normal limits  CBC WITH DIFFERENTIAL/PLATELET - Abnormal; Notable for the following components:   WBC 12.0 (*)    All other components within normal limits  RAPID URINE DRUG SCREEN, HOSP PERFORMED  CBG MONITORING, ED  I-STAT BETA HCG BLOOD, ED ([facility], [facility], [facility] ONLY)  CBG MONITORING, ED    EKG EKG Interpretation  Date/Time:  Thursday Jan 10 2021 14:44:54 EDT Ventricular Rate:  83 PR Interval:  177 QRS Duration: 72 QT Interval:  347 QTC Calculation: 408 R Axis:   55 Text Interpretation: Sinus rhythm since last tracing no significant change Confirmed by Mancel Bale [id number]) on 01/10/2021 4:09:30 PM   Radiology No results found.  Procedures Procedures   Medications Ordered in ED Medications  sodium chloride 0.9 % bolus 1,000 mL (has no administration in time range)  ketorolac (TORADOL) 30 MG/ML injection 15 mg (has no administration in time range)  diphenhydrAMINE (BENADRYL) injection 25 mg (has no administration in time range)  metoCLOPramide (REGLAN) injection 5 mg (has no administration in time range)    ED Course  I have reviewed the triage vital signs and the nursing notes.  Pertinent labs & imaging results that were available during my care of the patient were reviewed by me and considered in my medical decision making (see chart for details).  Clinical Course as of 01/10/21 2236  Thu Jan 10, 2021  1834 WBC(!): 12.0 [AH]    Clinical Course User Index [AH] Arthor Captain, PA-C   MDM Rules/Calculators/A&P                            Rebekah Leon presents with headache Given the large differential diagnosis for [facility], the decision making in this case is of high complexity.  I ordered and reviewed labs that include a CBC which shows mildly elevated white blood cell count, BMP with slightly elevated blood glucose, UDS negative, CBG within normal limits.  I-STAT hCG negative. I ordered and reviewed a CT angiogram of the  head which shows no acute abnormalities, EKG normal sinus rhythm at a rate of 83. Patient treated for migraine headache with Benadryl, Toradol, Reglan, fluids with improvement in her headache.  She has negative orthostatic vital signs. Patient blood pressure elevated throughout her visit here.  She is advised to follow closely with her primary care physician for her hypertension and headaches.  Patient ambulatory without assistance to the bathroom and back.  After evaluating all  of the data points in this case, the presentation of Rebekah Leon is NOT consistent with skull fracture, meningitis/encephalitis, SAH/sentinel bleed, Intracranial Hemorrhage (ICH) (subdural/epidural), acute obstructive hydrocephalus, space occupying lesions, CVA, CO Poisoning, Basilar/vertebral artery dissection, preeclampsia, cerebral venous thrombosis, hypertensive emergency, temporal Arteritis, Idiopathic Intracranial Hypertension (pseudotumor cerebri).  Strict return and follow-up precautions have been given by me personally or by detailed written instructions verbalized by nursing staff using the teach back method to patient/family/caregiver.  Data Reviewed/Counseling: I have reviewed the patient's vital signs, nursing notes, and other relevant tests/information. I had a detailed discussion regarding the historical points, exam findings, and any diagnostic results supporting the discharge diagnosis. I also discussed the need for outpatient follow-up and the need to return to the ED if symptoms worsen or if there are any questions or concerns that arise at hom   Final Clinical Impression(s) / ED Diagnoses Final diagnoses:  Bad headache  Near syncope  Elevated blood pressure reading    Rx / DC Orders ED Discharge Orders    None       Arthor Captain, PA-C 01/10/21 2323    Virgina Norfolk, DO 01/10/21 2326

## 2021-01-10 NOTE — Discharge Instructions (Signed)

## 2021-01-11 ENCOUNTER — Encounter (HOSPITAL_COMMUNITY): Payer: Self-pay

## 2021-01-11 ENCOUNTER — Emergency Department (HOSPITAL_COMMUNITY)
Admission: EM | Admit: 2021-01-11 | Discharge: 2021-01-11 | Disposition: A | Discharge status: Home / Self Care | Payer: BC Managed Care – PPO | Attending: Emergency Medicine | Admitting: Emergency Medicine

## 2021-01-11 DIAGNOSIS — R519 Headache, unspecified: Secondary | ICD-10-CM | POA: Insufficient documentation

## 2021-01-11 DIAGNOSIS — R55 Syncope and collapse: Secondary | ICD-10-CM | POA: Insufficient documentation

## 2021-01-11 DIAGNOSIS — J45909 Unspecified asthma, uncomplicated: Secondary | ICD-10-CM | POA: Diagnosis not present

## 2021-01-11 DIAGNOSIS — R531 Weakness: Secondary | ICD-10-CM | POA: Diagnosis not present

## 2021-01-11 LAB — CBC WITH DIFFERENTIAL/PLATELET
Abs Immature Granulocytes: 0.04 10*3/uL (ref 0.00–0.07)
Basophils Absolute: 0 10*3/uL (ref 0.0–0.1)
Basophils Relative: 0 %
Eosinophils Absolute: 0.1 10*3/uL (ref 0.0–0.5)
Eosinophils Relative: 1 %
HCT: 43.6 % (ref 36.0–46.0)
Hemoglobin: 13.5 g/dL (ref 12.0–15.0)
Immature Granulocytes: 0 %
Lymphocytes Relative: 23 %
Lymphs Abs: 2.4 10*3/uL (ref 0.7–4.0)
MCH: 27.2 pg (ref 26.0–34.0)
MCHC: 31 g/dL (ref 30.0–36.0)
MCV: 87.7 fL (ref 80.0–100.0)
Monocytes Absolute: 0.6 10*3/uL (ref 0.1–1.0)
Monocytes Relative: 6 %
Neutro Abs: 7 10*3/uL (ref 1.7–7.7)
Neutrophils Relative %: 70 %
Platelets: 298 10*3/uL (ref 150–400)
RBC: 4.97 MIL/uL (ref 3.87–5.11)
RDW: 14 % (ref 11.5–15.5)
WBC: 10.2 10*3/uL (ref 4.0–10.5)
nRBC: 0 % (ref 0.0–0.2)

## 2021-01-11 LAB — I-STAT CHEM 8, ED
BUN: 12 mg/dL (ref 6–20)
Calcium, Ion: 1.29 mmol/L (ref 1.15–1.40)
Chloride: 103 mmol/L (ref 98–111)
Creatinine, Ser: 0.9 mg/dL (ref 0.44–1.00)
Glucose, Bld: 93 mg/dL (ref 70–99)
HCT: 44 % (ref 36.0–46.0)
Hemoglobin: 15 g/dL (ref 12.0–15.0)
Potassium: 4.3 mmol/L (ref 3.5–5.1)
Sodium: 140 mmol/L (ref 135–145)
TCO2: 27 mmol/L (ref 22–32)

## 2021-01-11 LAB — CBG MONITORING, ED: Glucose-Capillary: 92 mg/dL (ref 70–99)

## 2021-01-11 MED ORDER — NAPROXEN 375 MG PO TABS
375.0000 mg | ORAL_TABLET | Freq: Once | ORAL | Status: AC
  Administered 2021-01-11: 375 mg via ORAL
  Filled 2021-01-11: qty 1

## 2021-01-11 NOTE — ED Provider Notes (Signed)
[facility] EMERGENCY DEPARTMENT Provider Note  CSN: [id number] Arrival date & time: 01/11/21 1144    History Chief Complaint  Patient presents with  . Weakness  . Headache    HPI  Rebekah Leon is a 20 y.o. female presents for re-evaluation of moderate frontal headache. She was seen yesterday for same along with an episode of near syncope and generalized weakness. She had an extensive workup including labs, CTA and given Troadol, Reglan, Benadryl with good relief. She was given Rx for Naprosyn and Meclizine but it was too late for her to pick up her Rx last night. She came back today because her headache returned, still feeling generally weak. No fever, neck pain or other new symptoms. She returned because her discharge instructions told her to come back if she wasn't doing better. She has a history of seizures as a child, but had not been taking any medications recently and no established neurologist.    Past Medical History:  Diagnosis Date  . Asthma   . Seizures (HCC)     History reviewed. No pertinent surgical history.  Family History  Family history unknown: Yes    Social History   Tobacco Use  . Smoking status: Never Smoker  . Smokeless tobacco: Never Used  Vaping Use  . Vaping Use: Never used  Substance Use Topics  . Alcohol use: Never  . Drug use: Never     Home Medications Prior to Admission medications   Medication Sig Start Date End Date Taking? Authorizing Provider  albuterol (VENTOLIN HFA) 108 (90 Base) MCG/ACT inhaler Inhale into the lungs every 6 (six) hours as needed for wheezing or shortness of breath.    [provider]  cyclobenzaprine (FLEXERIL) 10 MG tablet Take 1 tablet (10 mg total) by mouth 2 (two) times daily as needed for muscle spasms. 11/07/20   Bing Neighbors, FNP  meclizine (ANTIVERT) 25 MG tablet Take 1 tablet (25 mg total) by mouth 3 (three) times daily as needed for dizziness. 01/10/21   Arthor Captain, PA-C   naproxen (NAPROSYN) 375 MG tablet Take 1 tablet (375 mg total) by mouth 2 (two) times daily with a meal. 01/10/21   Arthor Captain, PA-C  tamsulosin (FLOMAX) 0.4 MG CAPS capsule Take 1 capsule (0.4 mg total) by mouth daily. 08/23/20   Wieters, Hallie C, PA-C     Allergies    Cheese   Review of Systems   Review of Systems A comprehensive review of systems was completed and negative except as noted in HPI.    Physical Exam BP (!) 155/108 (BP Location: Left Arm)   Pulse 70   Temp 99 F (37.2 C) (Oral)   Resp 15   LMP 01/08/2021 (Approximate)   SpO2 100%   Physical Exam Vitals and nursing note reviewed.  Constitutional:      Appearance: Normal appearance.  HENT:     Head: Normocephalic and atraumatic.     Nose: Nose normal.     Mouth/Throat:     Mouth: Mucous membranes are moist.  Eyes:     Extraocular Movements: Extraocular movements intact.     Conjunctiva/sclera: Conjunctivae normal.  Cardiovascular:     Rate and Rhythm: Normal rate.  Pulmonary:     Effort: Pulmonary effort is normal.     Breath sounds: Normal breath sounds.  Abdominal:     General: Abdomen is flat.     Palpations: Abdomen is soft.     Tenderness: There is no abdominal tenderness.  Musculoskeletal:        General: No swelling. Normal range of motion.     Cervical back: Neck supple.     Comments: R arm in sling from prior shoulder injury earlier this week  Skin:    General: Skin is warm and dry.  Neurological:     General: No focal deficit present.     Mental Status: She is alert and oriented to person, place, and time. Mental status is at baseline.     Cranial Nerves: No cranial nerve deficit.     Sensory: No sensory deficit.     Motor: No weakness.  Psychiatric:        Mood and Affect: Mood normal.      ED Results / Procedures / Treatments   Labs (all labs ordered are listed, but only abnormal results are displayed) Labs Reviewed  CBC WITH DIFFERENTIAL/PLATELET  I-STAT CHEM 8, ED   CBG MONITORING, ED    EKG None  Radiology CT Angio Head W or Wo Contrast  Result Date: 01/10/2021 CLINICAL DATA:  Dizziness, headache EXAM: CT ANGIOGRAPHY HEAD TECHNIQUE: Multidetector CT imaging of the head was performed using the standard protocol during bolus administration of intravenous contrast. Multiplanar CT image reconstructions and MIPs were obtained to evaluate the vascular anatomy. CONTRAST:  80mL OMNIPAQUE IOHEXOL 350 MG/ML SOLN COMPARISON:  None FINDINGS: CT HEAD Brain: There is no acute intracranial hemorrhage, mass effect, or edema. Gray-white differentiation is preserved. Ventricles and sulci are normal in size and configuration. No extra-axial collection. Vascular: No hyperdense vessel or unexpected calcification. Skull: Unremarkable. Sinuses: No acute abnormality. Other: Mastoid air cells are clear. CTA HEAD Anterior circulation: Intracranial internal carotid arteries are patent. Anterior and middle cerebral arteries are patent. Posterior circulation: Intracranial vertebral arteries, basilar artery, and posterior cerebral arteries are patent. Bilateral posterior communicating arteries are present. Venous sinuses: As permitted by contrast timing, patent. Review of the MIP images confirms the above findings. IMPRESSION: No acute intracranial abnormality. No proximal intracranial vessel occlusion. No aneurysm. Electronically Signed   By: Guadlupe Spanish M.D.   On: 01/10/2021 18:42    Procedures Procedures  Medications Ordered in the ED Medications  naproxen (NAPROSYN) tablet 375 mg (has no administration in time range)     MDM Rules/Calculators/A&P MDM Results from CT yesterday and repeat labs today are unremarkable and in fact her mild leukocytosis from yesterday has resolved. Offered IM Toradol as this helped her symptoms yesterday but she declined and requested oral meds instead. Advised to follow up with PCP for further management and/or referral to Neurology if her  symptoms persist.  ED Course  I have reviewed the triage vital signs and the nursing notes.  Pertinent labs & imaging results that were available during my care of the patient were reviewed by me and considered in my medical decision making (see chart for details).     Final Clinical Impression(s) / ED Diagnoses Final diagnoses:  Acute nonintractable headache, unspecified headache type  Generalized weakness    Rx / DC Orders ED Discharge Orders    None       Pollyann Savoy, MD 01/11/21 1627

## 2021-01-11 NOTE — ED Triage Notes (Signed)
Pt arrived via POV, c/o headache and generalized weakness. States earlier, felt her entire body go numb, went away and felt weakness throughout. States she was seen yesterday for same. No relief. Sent home with rx, not able to take yet. No focal deficits.

## 2021-01-11 NOTE — Discharge Instructions (Addendum)
Please take the pain medications as needed. Drink plenty of fluids and rest over the weekend. Follow up with your primary doctor if your symptoms persist as you may need to be referred to a neurologist

## 2021-01-11 NOTE — ED Provider Notes (Signed)
Emergency Medicine Provider Triage Evaluation Note  Rebekah Leon , a 20 y.o. female  was evaluated in triage.  Pt complains of weakness.  Review of Systems  Positive: Headache, dizzy, lightheadedness Negative: Fever, cold sxs, injury  Physical Exam  BP (!) 157/96 (BP Location: Left Arm)   Pulse 84   Temp 99 F (37.2 C) (Oral)   Resp 18   LMP 01/08/2021 (Approximate)   SpO2 98%  Gen:   Awake, no distress   Resp:  Normal effort  MSK:   Wear sling to R arm.  Otherwise moves other extremities without difficulty Other:    Medical Decision Making  Medically screening exam initiated at 12:05 PM.  Appropriate orders placed.  Rebekah Leon was informed that the remainder of the evaluation will be completed by another provider, this initial triage assessment does not replace that evaluation, and the importance of remaining in the ED until their evaluation is complete.  Patient with several episodes of generalized weakness lightheadedness for the past 2 days.  Was seen yesterday for same.  Endorsed headache.  Admits to not eating any food for the past 2 days and report decreased appetite   Fayrene Helper, PA-C 01/11/21 1208    Linwood Dibbles, MD 01/12/21 [phone]

## 2021-03-23 ENCOUNTER — Emergency Department (HOSPITAL_COMMUNITY)
Admission: EM | Admit: 2021-03-23 | Discharge: 2021-03-24 | Disposition: A | Discharge status: Home / Self Care | Payer: BC Managed Care – PPO | Attending: Emergency Medicine | Admitting: Emergency Medicine

## 2021-03-23 ENCOUNTER — Encounter (HOSPITAL_COMMUNITY): Payer: Self-pay | Admitting: Emergency Medicine

## 2021-03-23 DIAGNOSIS — H5789 Other specified disorders of eye and adnexa: Secondary | ICD-10-CM | POA: Diagnosis not present

## 2021-03-23 DIAGNOSIS — J45909 Unspecified asthma, uncomplicated: Secondary | ICD-10-CM | POA: Insufficient documentation

## 2021-03-23 DIAGNOSIS — R519 Headache, unspecified: Secondary | ICD-10-CM | POA: Diagnosis not present

## 2021-03-23 NOTE — ED Triage Notes (Signed)
Pt reports bilateral eye pain that started about 3pm after she felt something "flew into my eyes."  Pt reports she also now has a headache. Vision is now getting blurry.

## 2021-03-24 MED ORDER — PROMETHAZINE HCL 25 MG/ML IJ SOLN
25.0000 mg | Freq: Four times a day (QID) | INTRAMUSCULAR | Status: DC | PRN
  Administered 2021-03-24: 25 mg via INTRAMUSCULAR
  Filled 2021-03-24: qty 1

## 2021-03-24 MED ORDER — TETRACAINE HCL 0.5 % OP SOLN
2.0000 [drp] | Freq: Once | OPHTHALMIC | Status: AC
  Administered 2021-03-24: 2 [drp] via OPHTHALMIC
  Filled 2021-03-24: qty 4

## 2021-03-24 MED ORDER — FLUORESCEIN SODIUM 1 MG OP STRP
1.0000 | ORAL_STRIP | Freq: Once | OPHTHALMIC | Status: AC
  Administered 2021-03-24: 1 via OPHTHALMIC
  Filled 2021-03-24: qty 1

## 2021-03-24 MED ORDER — KETOROLAC TROMETHAMINE 60 MG/2ML IM SOLN
60.0000 mg | Freq: Once | INTRAMUSCULAR | Status: AC
  Administered 2021-03-24: 60 mg via INTRAMUSCULAR
  Filled 2021-03-24: qty 2

## 2021-03-24 NOTE — Discharge Instructions (Addendum)
Take ibuprofen 600 mg rotated with Tylenol 1000 mg every 4 hours as needed for pain.  Follow-up with your primary doctor if symptoms are not improving in the next few days.

## 2021-03-24 NOTE — ED Provider Notes (Signed)
[facility] EMERGENCY DEPARTMENT Provider Note   CSN: [id number] Arrival date & time: 03/23/21  2208     History Chief Complaint  Patient presents with   Eye Pain        Headache    Rebekah Leon is a 20 y.o. female.  Patient is a 20 year old female with history of asthma and seizures.  She presents today for evaluation of eye discomfort and headache.  Patient states that she woke from sleep this afternoon when she felt something fly into her eyes.  She had burning in her eyes afterward followed by headache and blurry vision.  Patient is uncertain as to what blew into her eyes.  She denies having had a fan running in the room.  She describes pressure to the front of her head with no associated visual disturbances, weakness, or numbness.  The history is provided by the patient.  Eye Pain This is a new problem. The current episode started 6 to 12 hours ago. The problem occurs constantly. The problem has not changed since onset.Associated symptoms include headaches. Nothing aggravates the symptoms. Nothing relieves the symptoms.  Headache Associated symptoms: eye pain       Past Medical History:  Diagnosis Date   Asthma    Seizures (HCC)     There are no problems to display for this patient.   No past surgical history on file.   OB History   No obstetric history on file.     Family History  Family history unknown: Yes    Social History   Tobacco Use   Smoking status: Never   Smokeless tobacco: Never  Vaping Use   Vaping Use: Never used  Substance Use Topics   Alcohol use: Never   Drug use: Never    Home Medications Prior to Admission medications   Medication Sig Start Date End Date Taking? Authorizing Provider  albuterol (VENTOLIN HFA) 108 (90 Base) MCG/ACT inhaler Inhale into the lungs every 6 (six) hours as needed for wheezing or shortness of breath.    [provider]  cyclobenzaprine (FLEXERIL) 10 MG tablet Take 1 tablet  (10 mg total) by mouth 2 (two) times daily as needed for muscle spasms. 11/07/20   Bing Neighbors, FNP  meclizine (ANTIVERT) 25 MG tablet Take 1 tablet (25 mg total) by mouth 3 (three) times daily as needed for dizziness. 01/10/21   Arthor Captain, PA-C  naproxen (NAPROSYN) 375 MG tablet Take 1 tablet (375 mg total) by mouth 2 (two) times daily with a meal. 01/10/21   Arthor Captain, PA-C  tamsulosin (FLOMAX) 0.4 MG CAPS capsule Take 1 capsule (0.4 mg total) by mouth daily. 08/23/20   Wieters, Hallie C, PA-C    Allergies    Cheese  Review of Systems   Review of Systems  Eyes:  Positive for pain.  Neurological:  Positive for headaches.  All other systems reviewed and are negative.  Physical Exam Updated Vital Signs BP 134/86 (BP Location: Right Arm)   Pulse 71   Temp 98.6 F (37 C) (Oral)   Resp 15   Ht 5\' 7"  (1.702 m)   Wt 104.3 kg   SpO2 100%   BMI 36.02 kg/m   Physical Exam Vitals and nursing note reviewed.  Constitutional:      General: She is not in acute distress.    Appearance: She is well-developed. She is not diaphoretic.  HENT:     Head: Normocephalic and atraumatic.  Eyes:  Extraocular Movements: Extraocular movements intact.     Right eye: Normal extraocular motion and no nystagmus.     Left eye: Normal extraocular motion and no nystagmus.     Pupils: Pupils are equal, round, and reactive to light.     Comments: Bilateral eyes are normal in appearance.  There is no abrasion to the cornea noted with visual inspection or fluorescein staining.  Pupils are equal and reactive.  Anterior chambers are clear.  Conjunctiva is normal in appearance and not injected.  Cardiovascular:     Rate and Rhythm: Normal rate and regular rhythm.     Heart sounds: No murmur heard.   No friction rub. No gallop.  Pulmonary:     Effort: Pulmonary effort is normal. No respiratory distress.     Breath sounds: Normal breath sounds. No wheezing.  Abdominal:     General: Bowel sounds  are normal. There is no distension.     Palpations: Abdomen is soft.     Tenderness: There is no abdominal tenderness.  Musculoskeletal:        General: Normal range of motion.     Cervical back: Normal range of motion and neck supple.  Skin:    General: Skin is warm and dry.  Neurological:     General: No focal deficit present.     Mental Status: She is alert and oriented to person, place, and time.     Cranial Nerves: No cranial nerve deficit, dysarthria or facial asymmetry.    ED Results / Procedures / Treatments   Labs (all labs ordered are listed, but only abnormal results are displayed) Labs Reviewed - No data to display  EKG None  Radiology No results found.  Procedures Procedures   Medications Ordered in ED Medications  ketorolac (TORADOL) injection 60 mg (has no administration in time range)  promethazine (PHENERGAN) injection 25 mg (has no administration in time range)  tetracaine (PONTOCAINE) 0.5 % ophthalmic solution 2 drop (2 drops Both Eyes Given 03/24/21 0321)  fluorescein ophthalmic strip 1 strip (1 strip Both Eyes Given 03/24/21 0321)    ED Course  I have reviewed the triage vital signs and the nursing notes.  Pertinent labs & imaging results that were available during my care of the patient were reviewed by me and considered in my medical decision making (see chart for details).    MDM Rules/Calculators/A&P  Patient presenting here with complaints of eye irritation and headache as described in the HPI.  Patient's corneas were stained with fluorescein, but no visual uptake was noted.  Neurologic exam is nonfocal and she is feeling better after receiving Toradol and Phenergan.  Patient seems appropriate for discharge.  I highly doubt any acute intracranial pathology or ocular emergency.  Final Clinical Impression(s) / ED Diagnoses Final diagnoses:  None    Rx / DC Orders ED Discharge Orders     None        Geoffery Lyons, MD 03/24/21 [phone]

## 2021-04-08 ENCOUNTER — Other Ambulatory Visit: Payer: Self-pay

## 2021-04-08 ENCOUNTER — Encounter (HOSPITAL_BASED_OUTPATIENT_CLINIC_OR_DEPARTMENT_OTHER): Payer: Self-pay | Admitting: Obstetrics and Gynecology

## 2021-04-08 ENCOUNTER — Emergency Department (HOSPITAL_BASED_OUTPATIENT_CLINIC_OR_DEPARTMENT_OTHER)
Admission: EM | Admit: 2021-04-08 | Discharge: 2021-04-08 | Disposition: A | Discharge status: Home / Self Care | Payer: BC Managed Care – PPO | Attending: Emergency Medicine | Admitting: Emergency Medicine

## 2021-04-08 ENCOUNTER — Emergency Department (HOSPITAL_BASED_OUTPATIENT_CLINIC_OR_DEPARTMENT_OTHER): Payer: BC Managed Care – PPO

## 2021-04-08 DIAGNOSIS — U071 COVID-19: Secondary | ICD-10-CM | POA: Insufficient documentation

## 2021-04-08 DIAGNOSIS — J0110 Acute frontal sinusitis, unspecified: Secondary | ICD-10-CM | POA: Diagnosis not present

## 2021-04-08 DIAGNOSIS — R519 Headache, unspecified: Secondary | ICD-10-CM | POA: Diagnosis present

## 2021-04-08 DIAGNOSIS — J45909 Unspecified asthma, uncomplicated: Secondary | ICD-10-CM | POA: Diagnosis not present

## 2021-04-08 DIAGNOSIS — G43009 Migraine without aura, not intractable, without status migrainosus: Secondary | ICD-10-CM | POA: Diagnosis not present

## 2021-04-08 MED ORDER — DIPHENHYDRAMINE HCL 50 MG/ML IJ SOLN
25.0000 mg | Freq: Once | INTRAMUSCULAR | Status: AC
  Administered 2021-04-08: 25 mg via INTRAVENOUS
  Filled 2021-04-08: qty 1

## 2021-04-08 MED ORDER — METOCLOPRAMIDE HCL 5 MG/ML IJ SOLN
10.0000 mg | Freq: Once | INTRAMUSCULAR | Status: AC
  Administered 2021-04-08: 10 mg via INTRAVENOUS
  Filled 2021-04-08: qty 2

## 2021-04-08 MED ORDER — AMOXICILLIN 500 MG PO CAPS: 500.0000 mg | ORAL_CAPSULE | Freq: Three times a day (TID) | ORAL | 0 refills | Status: AC

## 2021-04-08 MED ORDER — LACTATED RINGERS IV BOLUS
1000.0000 mL | Freq: Once | INTRAVENOUS | Status: AC
  Administered 2021-04-08: 1000 mL via INTRAVENOUS

## 2021-04-08 MED ORDER — ALBUTEROL SULFATE HFA 108 (90 BASE) MCG/ACT IN AERS: 2.0000 | INHALATION_SPRAY | Freq: Once | RESPIRATORY_TRACT | Status: DC

## 2021-04-08 MED ORDER — DEXAMETHASONE SODIUM PHOSPHATE 10 MG/ML IJ SOLN
10.0000 mg | Freq: Once | INTRAMUSCULAR | Status: AC
  Administered 2021-04-08: 10 mg via INTRAVENOUS
  Filled 2021-04-08: qty 1

## 2021-04-08 NOTE — Discharge Instructions (Addendum)
Make sure you are drinking plenty of fluids.  Rest.  You can take Tylenol and ibuprofen as needed for pain.

## 2021-04-08 NOTE — ED Triage Notes (Signed)
Patient reports to the ER for headache, COVID positive. Patient reports she tested positive on 04/03/21

## 2021-04-08 NOTE — ED Provider Notes (Signed)
[facility] EMERGENCY DEPT Provider Note   CSN: [id number] Arrival date & time: 04/08/21  1503     History Chief Complaint  Patient presents with   Covid Positive   Headache    Rebekah Leon is a 20 y.o. female.  The history is provided by the patient.  Headache Pain location:  Frontal Quality:  Sharp and stabbing Radiates to:  Does not radiate Severity currently:  9/10 Severity at highest:  9/10 Onset quality:  Gradual Duration:  2 days Timing:  Constant Progression:  Unchanged Chronicity:  New Similar to prior headaches: no   Context comment:  Recent URI and COVID positive Relieved by:  Nothing Worsened by:  Activity and sound Ineffective treatments:  None tried Associated symptoms: cough, dizziness, facial pain, fever, nausea and photophobia   Associated symptoms: no abdominal pain, no blurred vision, no diarrhea, no ear pain, no hearing loss, no neck pain, no neck stiffness, no paresthesias, no tingling, no visual change and no vomiting   Associated symptoms comment:  Fever has been persistent for about 1 week Risk factors comment:  Hx of migraine, sz and asthma     Past Medical History:  Diagnosis Date   Asthma    Seizures (HCC)     There are no problems to display for this patient.   History reviewed. No pertinent surgical history.   OB History     Gravida  0   Para  0   Term  0   Preterm  0   AB  0   Living  0      SAB  0   IAB  0   Ectopic  0   Multiple  0   Live Births  0           Family History  Family history unknown: Yes    Social History   Tobacco Use   Smoking status: Never    Passive exposure: Never   Smokeless tobacco: Never  Vaping Use   Vaping Use: Never used  Substance Use Topics   Alcohol use: Yes    Alcohol/week: 1.0 standard drink    Types: 1 Glasses of wine per week   Drug use: Never    Home Medications Prior to Admission medications   Medication Sig Start Date End Date  Taking? Authorizing Provider  albuterol (VENTOLIN HFA) 108 (90 Base) MCG/ACT inhaler Inhale into the lungs every 6 (six) hours as needed for wheezing or shortness of breath.    [provider]  cyclobenzaprine (FLEXERIL) 10 MG tablet Take 1 tablet (10 mg total) by mouth 2 (two) times daily as needed for muscle spasms. 11/07/20   Bing Neighbors, FNP  meclizine (ANTIVERT) 25 MG tablet Take 1 tablet (25 mg total) by mouth 3 (three) times daily as needed for dizziness. 01/10/21   Arthor Captain, PA-C  naproxen (NAPROSYN) 375 MG tablet Take 1 tablet (375 mg total) by mouth 2 (two) times daily with a meal. 01/10/21   Arthor Captain, PA-C  tamsulosin (FLOMAX) 0.4 MG CAPS capsule Take 1 capsule (0.4 mg total) by mouth daily. 08/23/20   Wieters, Hallie C, PA-C    Allergies    Cheese  Review of Systems   Review of Systems  Constitutional:  Positive for fever.  HENT:  Negative for ear pain and hearing loss.   Eyes:  Positive for photophobia. Negative for blurred vision.  Respiratory:  Positive for cough.   Gastrointestinal:  Positive for nausea. Negative for abdominal  pain, diarrhea and vomiting.  Musculoskeletal:  Negative for neck pain and neck stiffness.  Neurological:  Positive for dizziness and headaches. Negative for paresthesias.  All other systems reviewed and are negative.  Physical Exam Updated Vital Signs BP 137/83 (BP Location: Right Arm)   Pulse 67   Temp 98.2 F (36.8 C)   Resp 15   Ht 5\' 7"  (1.702 m)   Wt 105 kg   LMP 04/04/2021 (Exact Date)   SpO2 99%   BMI 36.26 kg/m   Physical Exam Vitals and nursing note reviewed.  Constitutional:      General: She is not in acute distress.    Appearance: She is well-developed.  HENT:     Head: Normocephalic and atraumatic.     Right Ear: Tympanic membrane normal.     Left Ear: Tympanic membrane normal.     Nose:     Right Turbinates: Enlarged.     Left Turbinates: Enlarged.     Right Sinus: Frontal sinus tenderness  present. No maxillary sinus tenderness.     Left Sinus: Frontal sinus tenderness present. No maxillary sinus tenderness.     Mouth/Throat:     Mouth: Mucous membranes are moist.  Eyes:     Conjunctiva/sclera: Conjunctivae normal.     Pupils: Pupils are equal, round, and reactive to light.  Cardiovascular:     Rate and Rhythm: Normal rate and regular rhythm.     Pulses: Normal pulses.     Heart sounds: No murmur heard. Pulmonary:     Effort: Pulmonary effort is normal. No respiratory distress.     Breath sounds: Normal breath sounds. No wheezing or rales.  Abdominal:     General: There is no distension.     Palpations: Abdomen is soft.     Tenderness: There is no abdominal tenderness. There is no guarding or rebound.  Musculoskeletal:        General: No tenderness. Normal range of motion.     Cervical back: Normal range of motion and neck supple. No tenderness. No spinous process tenderness or muscular tenderness.     Right lower leg: No edema.     Left lower leg: No edema.  Skin:    General: Skin is warm and dry.     Capillary Refill: Capillary refill takes less than 2 seconds.     Findings: No erythema or rash.  Neurological:     Mental Status: She is alert and oriented to person, place, and time. Mental status is at baseline.     Sensory: No sensory deficit.     Motor: No weakness.     Coordination: Coordination normal.     Gait: Gait normal.  Psychiatric:        Mood and Affect: Mood normal.        Behavior: Behavior normal.        Thought Content: Thought content normal.    ED Results / Procedures / Treatments   Labs (all labs ordered are listed, but only abnormal results are displayed) Labs Reviewed - No data to display  EKG None  Radiology CT Head Wo Contrast  Result Date: 04/08/2021 CLINICAL DATA:  Dizziness, headache, COVID-19 positive 04/03/2021 EXAM: CT HEAD WITHOUT CONTRAST TECHNIQUE: Contiguous axial images were obtained from the base of the skull through  the vertex without intravenous contrast. COMPARISON:  CT angio head 01/10/2021 FINDINGS: Brain: No evidence of acute infarction, hemorrhage, hydrocephalus, extra-axial collection, visible mass lesion or mass effect. Vascular: No hyperdense vessel or  unexpected calcification. Skull: No calvarial fracture or suspicious osseous lesion. No scalp swelling or hematoma. Sinuses/Orbits: Diffuse mural thickening throughout the paranasal sinuses with layering air-fluid levels in the frontal sinuses. Included orbital structures are unremarkable. Other: Minimal debris in the external auditory canals. IMPRESSION: No acute intracranial abnormality. Diffuse mural thickening of the paranasal sinuses with layering air-fluid levels in the frontal sinuses. Could correlate for clinical features of acute sinusitis. Electronically Signed   By: Kreg Shropshire M.D.   On: 04/08/2021 16:35    Procedures Procedures   Medications Ordered in ED Medications  metoCLOPramide (REGLAN) injection 10 mg (has no administration in time range)  lactated ringers bolus 1,000 mL (has no administration in time range)  diphenhydrAMINE (BENADRYL) injection 25 mg (has no administration in time range)  dexamethasone (DECADRON) injection 10 mg (has no administration in time range)    ED Course  I have reviewed the triage vital signs and the nursing notes.  Pertinent labs & imaging results that were available during my care of the patient were reviewed by me and considered in my medical decision making (see chart for details).    MDM Rules/Calculators/A&P                           Patient is a 20 year old female presenting today with persistent headache for the last 2 days.  Patient tested positive for COVID on 04/03/2021 and reports since that time she has had a dry cough, fever but had this headache that started 2 days ago and has been persistent.  Prior history of migraine but she reports that this headache is different.  It is more severe  in nature and she has dizziness that she describes as lightheaded and off-balance sensation with it.  With her migraine headaches she will get some visual blurriness and then have the headache but it normally will go away.  She has had nausea without vomiting.  She spoke with her doctor today and they scared her telling her she could have a brain aneurysm.  She has no evidence for meningitis.  She does have frontal tenderness with possibility for a sinusitis.  She has no focal neurologic symptoms on exam.  Head CT to further evaluate but low suspicion for intracranial hemorrhage or space-occupying lesion.  She has no visual changes and low suspicion for aneurysm.  Low suspicion for cavernous venous thrombosis.  Will give headache cocktail and reevaluate.  4:52 PM CT is negative except for frontal sinusitis.  Given patient's symptoms have been greater than a week will cover for possible bacterial component with amoxicillin.  Patient's headache is improved after IV meds.  She remains neurovascularly intact.  Will discharge home  MDM   Amount and/or Complexity of Data Reviewed Tests in the radiology section of CPT: ordered and reviewed Independent visualization of images, tracings, or specimens: yes    Final Clinical Impression(s) / ED Diagnoses Final diagnoses:  Migraine without aura and without status migrainosus, not intractable  Subacute frontal sinusitis  COVID    Rx / DC Orders ED Discharge Orders          Ordered    amoxicillin (AMOXIL) 500 MG capsule  3 times daily        04/08/21 1653             Gwyneth Sprout, MD 04/08/21 1653

## 2021-09-06 ENCOUNTER — Ambulatory Visit (HOSPITAL_COMMUNITY): Admit: 2021-09-06 | Discharge status: Against Medical Advice | Payer: BC Managed Care – PPO

## 2021-09-10 ENCOUNTER — Other Ambulatory Visit: Payer: Self-pay | Admitting: Family Medicine

## 2021-09-10 DIAGNOSIS — R102 Pelvic and perineal pain: Secondary | ICD-10-CM

## 2021-09-11 ENCOUNTER — Ambulatory Visit
Admission: RE | Admit: 2021-09-11 | Discharge: 2021-09-11 | Disposition: A | Discharge status: Home / Self Care | Payer: BC Managed Care – PPO | Source: Ambulatory Visit | Attending: Family Medicine | Admitting: Family Medicine

## 2021-09-11 DIAGNOSIS — R102 Pelvic and perineal pain: Secondary | ICD-10-CM

## 2021-09-21 ENCOUNTER — Other Ambulatory Visit: Payer: Self-pay | Admitting: Family Medicine

## 2021-09-21 DIAGNOSIS — N644 Mastodynia: Secondary | ICD-10-CM

## 2022-01-14 ENCOUNTER — Encounter (HOSPITAL_COMMUNITY): Payer: Self-pay | Admitting: Emergency Medicine

## 2022-01-14 ENCOUNTER — Emergency Department (HOSPITAL_COMMUNITY): Payer: BC Managed Care – PPO

## 2022-01-14 ENCOUNTER — Emergency Department (HOSPITAL_COMMUNITY)
Admission: EM | Admit: 2022-01-14 | Discharge: 2022-01-14 | Disposition: A | Discharge status: Home / Self Care | Payer: BC Managed Care – PPO | Attending: Emergency Medicine | Admitting: Emergency Medicine

## 2022-01-14 DIAGNOSIS — T7840XA Allergy, unspecified, initial encounter: Secondary | ICD-10-CM | POA: Insufficient documentation

## 2022-01-14 LAB — CBC WITH DIFFERENTIAL/PLATELET
Abs Immature Granulocytes: 0.04 10*3/uL (ref 0.00–0.07)
Basophils Absolute: 0.1 10*3/uL (ref 0.0–0.1)
Basophils Relative: 0 %
Eosinophils Absolute: 0.1 10*3/uL (ref 0.0–0.5)
Eosinophils Relative: 1 %
HCT: 38.1 % (ref 36.0–46.0)
Hemoglobin: 11.7 g/dL — ABNORMAL LOW (ref 12.0–15.0)
Immature Granulocytes: 0 %
Lymphocytes Relative: 25 %
Lymphs Abs: 3 10*3/uL (ref 0.7–4.0)
MCH: 26.8 pg (ref 26.0–34.0)
MCHC: 30.7 g/dL (ref 30.0–36.0)
MCV: 87.2 fL (ref 80.0–100.0)
Monocytes Absolute: 0.8 10*3/uL (ref 0.1–1.0)
Monocytes Relative: 6 %
Neutro Abs: 8.4 10*3/uL — ABNORMAL HIGH (ref 1.7–7.7)
Neutrophils Relative %: 68 %
Platelets: 252 10*3/uL (ref 150–400)
RBC: 4.37 MIL/uL (ref 3.87–5.11)
RDW: 14.2 % (ref 11.5–15.5)
WBC: 12.4 10*3/uL — ABNORMAL HIGH (ref 4.0–10.5)
nRBC: 0 % (ref 0.0–0.2)

## 2022-01-14 LAB — BASIC METABOLIC PANEL
Anion gap: 9 (ref 5–15)
BUN: 13 mg/dL (ref 6–20)
CO2: 25 mmol/L (ref 22–32)
Calcium: 9.3 mg/dL (ref 8.9–10.3)
Chloride: 105 mmol/L (ref 98–111)
Creatinine, Ser: 0.91 mg/dL (ref 0.44–1.00)
GFR, Estimated: >60 mL/min (ref >60)
Glucose, Bld: 108 mg/dL — ABNORMAL HIGH (ref 70–99)
Potassium: 4 mmol/L (ref 3.5–5.1)
Sodium: 139 mmol/L (ref 135–145)

## 2022-01-14 LAB — I-STAT BETA HCG BLOOD, ED (MC, WL, AP ONLY): I-stat hCG, quantitative: <5 m[IU]/mL (ref <5)

## 2022-01-14 MED ORDER — EPINEPHRINE 0.3 MG/0.3ML IJ SOAJ: 0.3000 mg | INTRAMUSCULAR | 0 refills | Status: AC | PRN

## 2022-01-14 MED ORDER — FAMOTIDINE 20 MG PO TABS
20.0000 mg | ORAL_TABLET | Freq: Once | ORAL | Status: AC
  Administered 2022-01-14: 20 mg via ORAL
  Filled 2022-01-14: qty 1

## 2022-01-14 MED ORDER — PREDNISONE 10 MG PO TABS: 40.0000 mg | ORAL_TABLET | Freq: Every day | ORAL | 0 refills | Status: AC

## 2022-01-14 MED ORDER — ONDANSETRON HCL 4 MG/2ML IJ SOLN
4.0000 mg | Freq: Once | INTRAMUSCULAR | Status: AC
  Administered 2022-01-14: 4 mg via INTRAVENOUS
  Filled 2022-01-14: qty 2

## 2022-01-14 MED ORDER — FAMOTIDINE 20 MG PO TABS: 20.0000 mg | ORAL_TABLET | Freq: Two times a day (BID) | ORAL | 0 refills | Status: AC

## 2022-01-14 MED ORDER — LACTATED RINGERS IV BOLUS
1000.0000 mL | Freq: Once | INTRAVENOUS | Status: AC
  Administered 2022-01-14: 1000 mL via INTRAVENOUS

## 2022-01-14 MED ORDER — DIPHENHYDRAMINE HCL 25 MG PO TABS: 25.0000 mg | ORAL_TABLET | Freq: Four times a day (QID) | ORAL | 0 refills | Status: AC | PRN

## 2022-01-14 MED ORDER — IOHEXOL 300 MG/ML  SOLN
75.0000 mL | Freq: Once | INTRAMUSCULAR | Status: AC | PRN
  Administered 2022-01-14: 75 mL via INTRAVENOUS

## 2022-01-14 MED ORDER — PREDNISONE 20 MG PO TABS
60.0000 mg | ORAL_TABLET | Freq: Once | ORAL | Status: AC
  Administered 2022-01-14: 60 mg via ORAL
  Filled 2022-01-14: qty 3
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
## 2022-01-14 NOTE — Discharge Instructions (Signed)
Prescriptions for medications were sent to your pharmacy.  These are medications that we will treat an allergic reaction.  Continue to take these for the next 3 days.  There was also a prescription for an EpiPen.  Take this only as needed in the event of a severe allergic reaction.  Your symptoms should continue to improve.  If they do not, please return to the emergency department. ?
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
## 2022-01-14 NOTE — ED Notes (Signed)
Pt reports throat is feeling better. Airway intact. Pt continues to have a soft voice. Continues to endorse some sore throat. Cardiac monitoring in place.  ?

## 2022-01-14 NOTE — ED Notes (Signed)
Pt called out stating that her throat felt like it was "clogging and tightening up". EDP Dixon notified and examining pt at this time. ?

## 2022-04-28 ENCOUNTER — Other Ambulatory Visit: Payer: Self-pay

## 2022-04-28 ENCOUNTER — Emergency Department (HOSPITAL_COMMUNITY)
Admission: EM | Admit: 2022-04-28 | Discharge: 2022-04-28 | Discharge status: Against Medical Advice | Payer: 59 | Attending: Emergency Medicine | Admitting: Emergency Medicine

## 2022-04-28 ENCOUNTER — Emergency Department (HOSPITAL_COMMUNITY): Payer: 59

## 2022-04-28 ENCOUNTER — Encounter (HOSPITAL_COMMUNITY): Payer: Self-pay

## 2022-04-28 DIAGNOSIS — R42 Dizziness and giddiness: Secondary | ICD-10-CM | POA: Diagnosis not present

## 2022-04-28 DIAGNOSIS — R0789 Other chest pain: Secondary | ICD-10-CM | POA: Insufficient documentation

## 2022-04-28 DIAGNOSIS — R55 Syncope and collapse: Secondary | ICD-10-CM | POA: Diagnosis not present

## 2022-04-28 DIAGNOSIS — Z5321 Procedure and treatment not carried out due to patient leaving prior to being seen by health care provider: Secondary | ICD-10-CM | POA: Insufficient documentation

## 2022-04-28 LAB — BASIC METABOLIC PANEL
Anion gap: 7 (ref 5–15)
BUN: 13 mg/dL (ref 6–20)
CO2: 26 mmol/L (ref 22–32)
Calcium: 9.6 mg/dL (ref 8.9–10.3)
Chloride: 106 mmol/L (ref 98–111)
Creatinine, Ser: 0.89 mg/dL (ref 0.44–1.00)
GFR, Estimated: >60 mL/min (ref >60)
Glucose, Bld: 89 mg/dL (ref 70–99)
Potassium: 3.7 mmol/L (ref 3.5–5.1)
Sodium: 139 mmol/L (ref 135–145)

## 2022-04-28 LAB — CBC
HCT: 42.1 % (ref 36.0–46.0)
Hemoglobin: 13.1 g/dL (ref 12.0–15.0)
MCH: 27 pg (ref 26.0–34.0)
MCHC: 31.1 g/dL (ref 30.0–36.0)
MCV: 86.8 fL (ref 80.0–100.0)
Platelets: 309 10*3/uL (ref 150–400)
RBC: 4.85 MIL/uL (ref 3.87–5.11)
RDW: 13.5 % (ref 11.5–15.5)
WBC: 11.9 10*3/uL — ABNORMAL HIGH (ref 4.0–10.5)
nRBC: 0 % (ref 0.0–0.2)

## 2022-04-28 LAB — I-STAT BETA HCG BLOOD, ED (MC, WL, AP ONLY): I-stat hCG, quantitative: <5 m[IU]/mL (ref <5)

## 2022-04-28 LAB — TROPONIN I (HIGH SENSITIVITY): Troponin I (High Sensitivity): 3 ng/L (ref <18)

## 2022-04-28 MED ORDER — IBUPROFEN 400 MG PO TABS
600.0000 mg | ORAL_TABLET | Freq: Once | ORAL | Status: AC
  Administered 2022-04-28: 600 mg via ORAL
  Filled 2022-04-28: qty 1

## 2022-04-28 MED ORDER — ACETAMINOPHEN 325 MG PO TABS
650.0000 mg | ORAL_TABLET | Freq: Once | ORAL | Status: AC
  Administered 2022-04-28: 650 mg via ORAL
  Filled 2022-04-28: qty 2

## 2022-04-28 NOTE — ED Triage Notes (Signed)
Patient was getting blood drawn in lab and then afterwards had syncopal episode.  Also complains of chest pain but having a lot of emotional stress.  Syncopal ep[isode lasted about 5 secs collapsing in the grass no postical.

## 2022-04-28 NOTE — ED Notes (Signed)
Pt left AMA °

## 2022-04-28 NOTE — ED Provider Triage Note (Signed)
Emergency Medicine Provider Triage Evaluation Note  Sha Burling , a 21 y.o. female  was evaluated in triage.  Pt complains of chest pain, chest tightness, and then syncopal episode.  Patient reports that she is having a lot of emotional stress.  She had been getting blood draw prior to syncopal episode.  She reports that the syncope lasted around 5 seconds, she collapsed in the grass, she does endorse hitting her head.  She has a history of seizures but there is no tonic-clonic activity noted, and patient reports no postictal state.  She is alert and oriented x3 right now.  She reports some ongoing feeling of uneasiness, chest pain, lightheadedness.  She denies a history of anxiety, previous episodes of syncope.  She does not take any seizure prophylaxis medication and cannot describe anything about her seizure history to me.  Review of Systems  Positive: Syncope, chest pain Negative: Shortness of breath, nausea, vomiting  Physical Exam  Ht 5\' 7"  (1.702 m)   Wt 104.8 kg   SpO2 96%   BMI 36.18 kg/m  Gen:   Awake, no distress   Resp:  Normal effort  MSK:   Moves extremities without difficulty  Other:  No significant noted lacerations or wounds.  Medical Decision Making  Medically screening exam initiated at 5:54 PM.  Appropriate orders placed.  Reign-Ann Spoonemore was informed that the remainder of the evaluation will be completed by another provider, this initial triage assessment does not replace that evaluation, and the importance of remaining in the ED until their evaluation is complete.  Workup initiated   , Olene Floss 04/28/22 1757

## 2022-06-21 IMAGING — CT CT HEAD W/O CM
4 series · 16 of 47 positions shown, 18 images · non-contrast
Comparison: CT angio head 01/10/2021

CLINICAL DATA: Dizziness, headache, [id number] positive 04/03/2021

EXAM:
CT HEAD WITHOUT CONTRAST
TECHNIQUE: Contiguous axial images were obtained from the base of the skull
through the vertex without intravenous contrast.

[Series 3: head wo · axial · 0.44mm/px · z∈[-485,-365]mm · 7 of 32 slices shown, 9 images]
[im 4/32  brain]
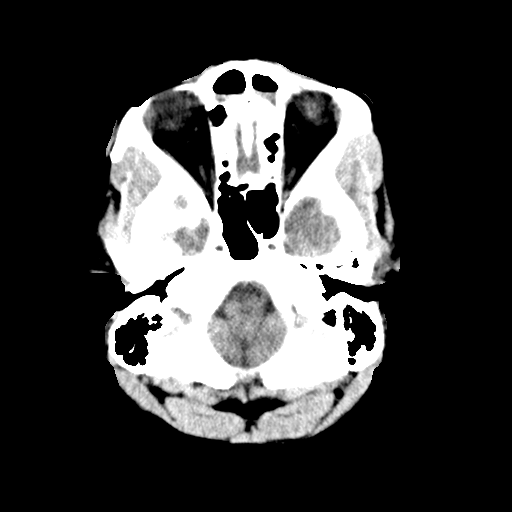
[im 4/32  bone]
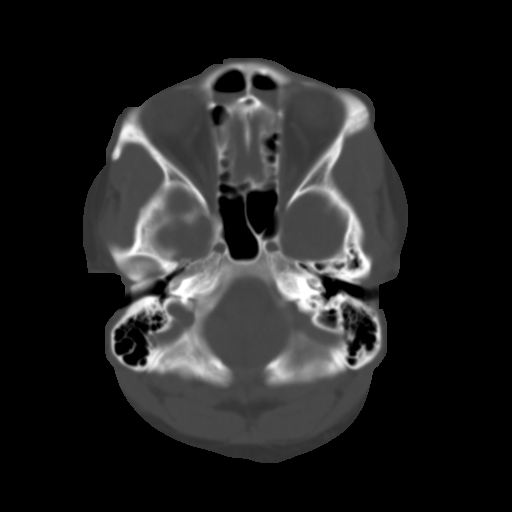
[im 8/32  brain]
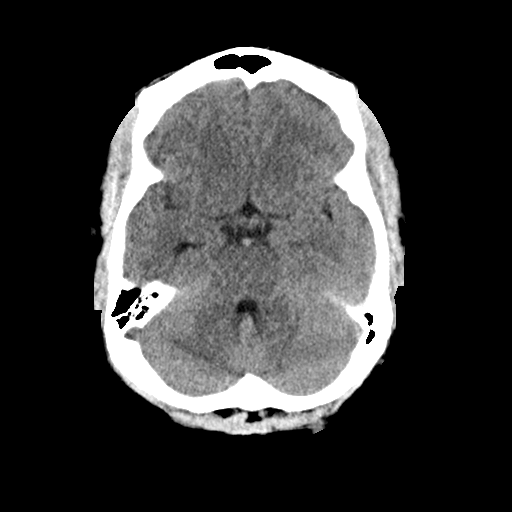
[im 12/32  brain]
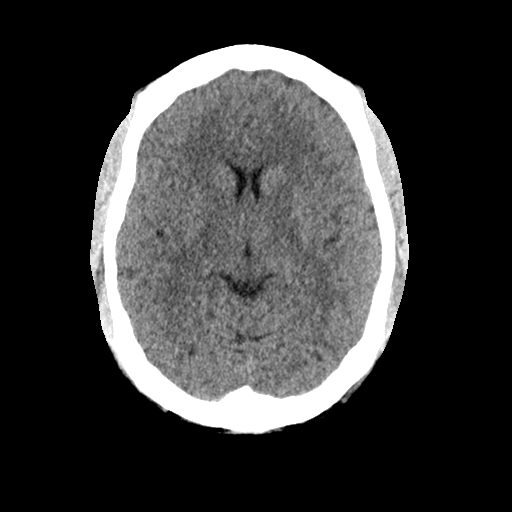
[im 16/32  brain]
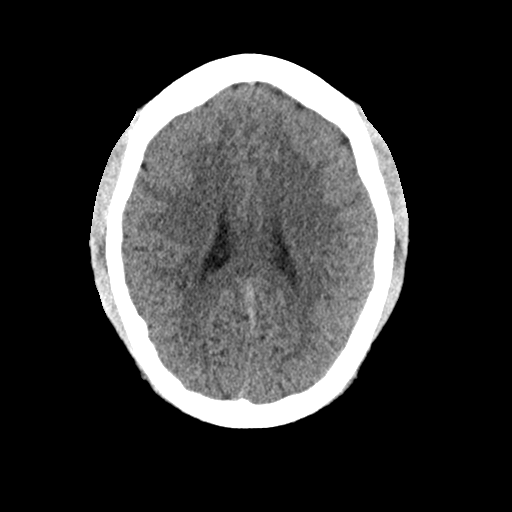
[im 20/32  brain]
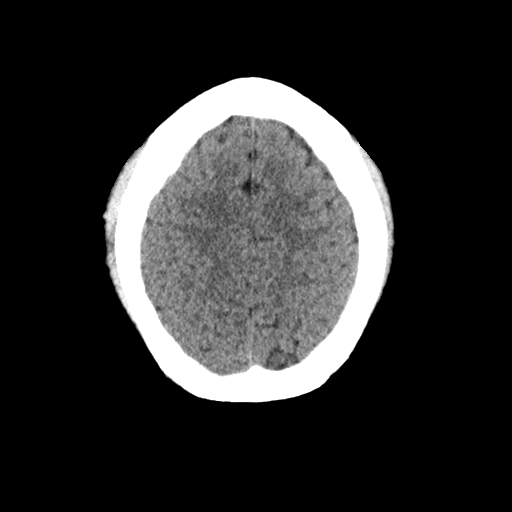
[im 20/32  bone]
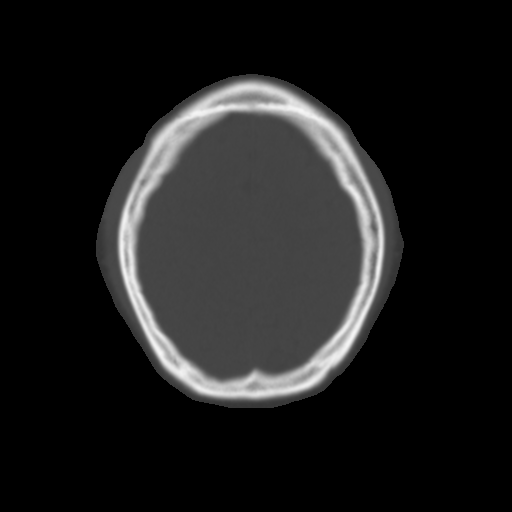
[im 24/32  brain]
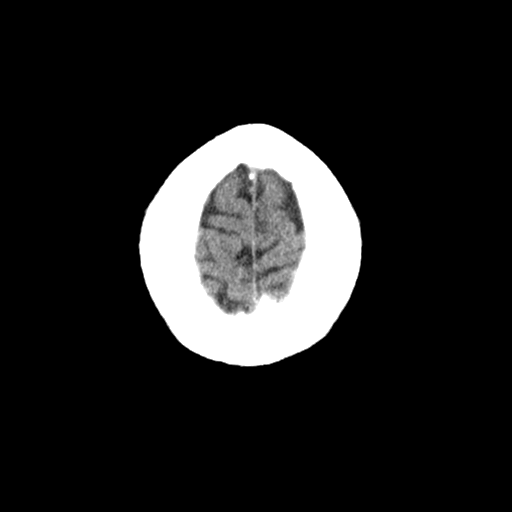
[im 28/32  brain]
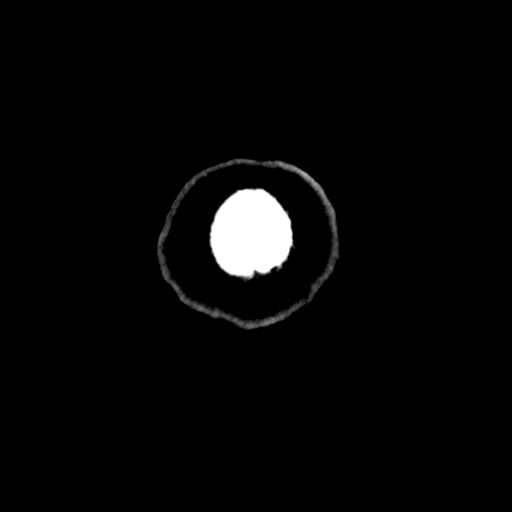

[Series 4: head bone · axial · 0.44mm/px · z∈[-486,-454]mm · 3 of 80 slices shown]
[im 8/80  bone]
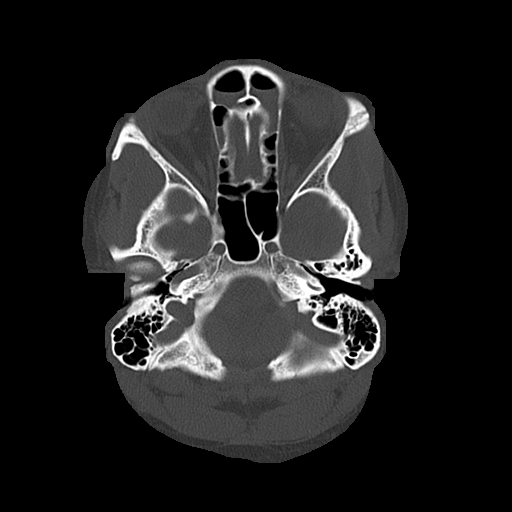
[im 16/80  bone]
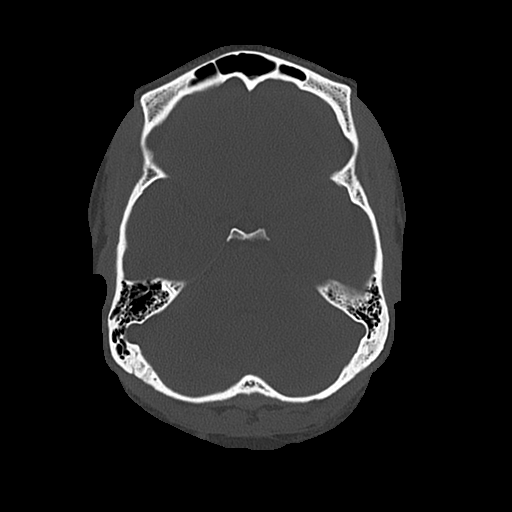
[im 24/80  bone]
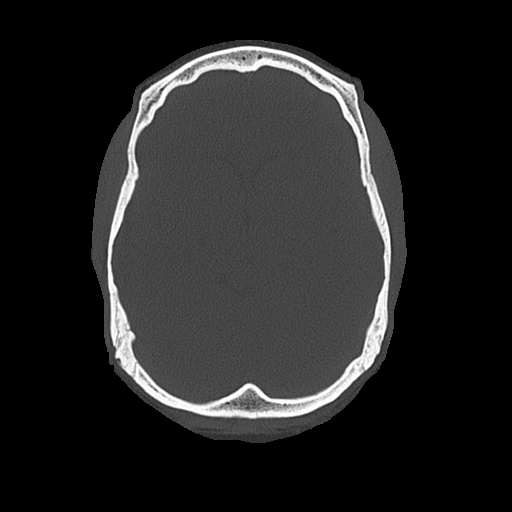

[Series 5: coronal soft · coronal · 0.33mm/px · 3 of 69 slices shown]
[im 23/69  brain]
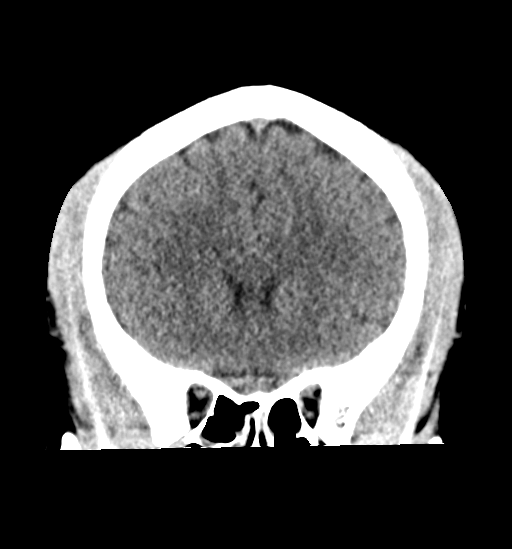
[im 31/69  brain]
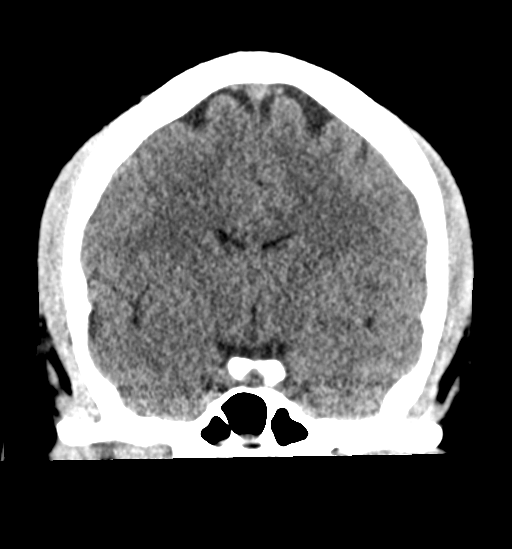
[im 38/69  brain]
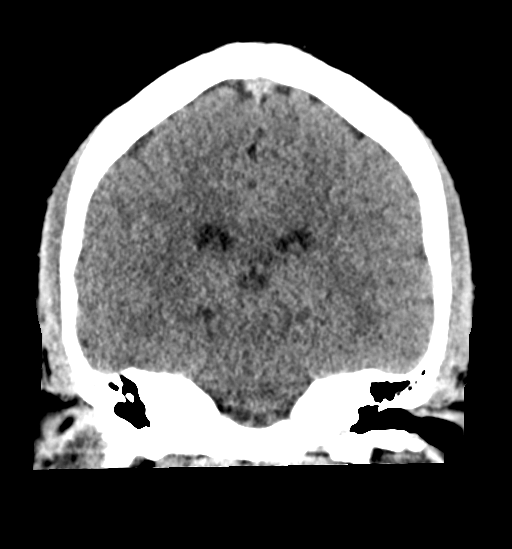

[Series 6: sagittal soft · sagittal · 0.34mm/px · 3 of 57 slices shown]
[im 19/57  brain]
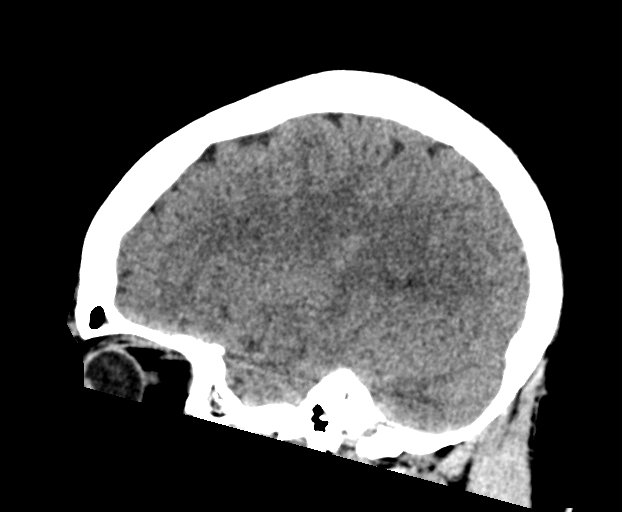
[im 29/57  brain]
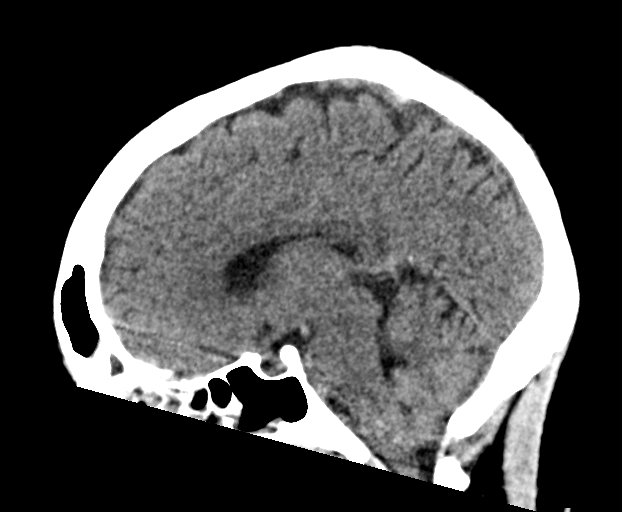
[im 38/57  brain]
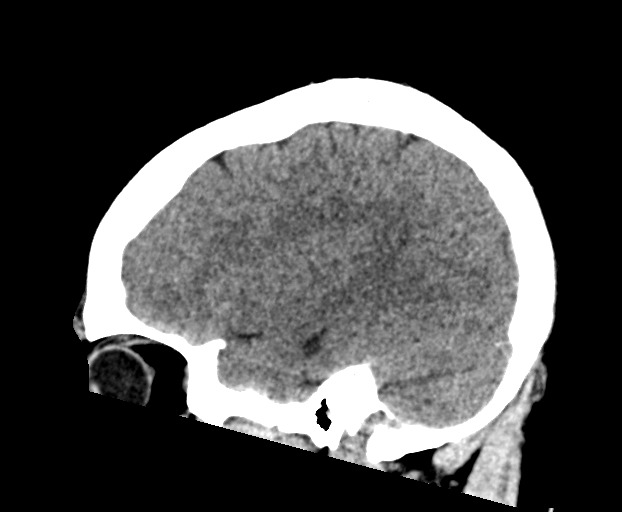

[16 of 47 positions shown; findings below may reference images not displayed]

FINDINGS: Brain: No evidence of acute infarction, hemorrhage, hydrocephalus,
extra-axial collection, visible mass lesion or mass effect.

Vascular: No hyperdense vessel or unexpected calcification.

Skull: No calvarial fracture or suspicious osseous lesion. No scalp
swelling or hematoma.

Sinuses/Orbits: Diffuse mural thickening throughout the paranasal
sinuses with layering air-fluid levels in the frontal sinuses.
Included orbital structures are unremarkable.

Other: Minimal debris in the external auditory canals.
IMPRESSION: No acute intracranial abnormality. Diffuse mural thickening of the
paranasal sinuses with layering air-fluid levels in the frontal
sinuses.

Could correlate for clinical features of acute sinusitis.

## 2023-02-08 ENCOUNTER — Emergency Department (HOSPITAL_BASED_OUTPATIENT_CLINIC_OR_DEPARTMENT_OTHER): Payer: Self-pay | Admitting: Radiology

## 2023-02-08 ENCOUNTER — Emergency Department (HOSPITAL_BASED_OUTPATIENT_CLINIC_OR_DEPARTMENT_OTHER)
Admission: EM | Admit: 2023-02-08 | Discharge: 2023-02-08 | Disposition: A | Discharge status: Home / Self Care | Payer: Self-pay | Attending: Emergency Medicine | Admitting: Emergency Medicine

## 2023-02-08 ENCOUNTER — Encounter (HOSPITAL_BASED_OUTPATIENT_CLINIC_OR_DEPARTMENT_OTHER): Payer: Self-pay | Admitting: Emergency Medicine

## 2023-02-08 DIAGNOSIS — W010XXA Fall on same level from slipping, tripping and stumbling without subsequent striking against object, initial encounter: Secondary | ICD-10-CM | POA: Insufficient documentation

## 2023-02-08 DIAGNOSIS — M79644 Pain in right finger(s): Secondary | ICD-10-CM | POA: Insufficient documentation

## 2023-02-08 DIAGNOSIS — Y9389 Activity, other specified: Secondary | ICD-10-CM | POA: Insufficient documentation

## 2023-02-08 DIAGNOSIS — S43401A Unspecified sprain of right shoulder joint, initial encounter: Secondary | ICD-10-CM | POA: Insufficient documentation

## 2023-02-08 NOTE — ED Notes (Signed)
Reviewed AVS with patient, patient expressed understanding of directions, denies further questions at this time. 

## 2023-02-08 NOTE — ED Notes (Signed)
XR at bedside

## 2023-02-08 NOTE — ED Provider Notes (Signed)
[place] EMERGENCY DEPARTMENT AT [facility] Provider Note   CSN: [id number] Arrival date & time: 02/08/23  2159     History  Chief Complaint  Patient presents with   Arm Injury    Rebekah Leon is a 22 y.o. female.  22 yo F with a chief complaints of falling.  The patient was playing hopscotch and she lost her balance when she was trying to twist on 1 leg.  Landed on her right shoulder.  Complaining mostly of right shoulder and right thumb pain.  She denies head injury denies loss of consciousness denies neck pain back pain chest pain abdominal pain.  Has been able to ambulate without issue.  Pain with movement of the right shoulder and right thumb.  Has had some trouble using the right thumb at home.   Arm Injury      Home Medications Prior to Admission medications   Medication Sig Start Date End Date Taking? Authorizing Provider  albuterol (VENTOLIN HFA) 108 (90 Base) MCG/ACT inhaler Inhale into the lungs every 6 (six) hours as needed for wheezing or shortness of breath.    [provider]  amoxicillin (AMOXIL) 500 MG capsule Take 1 capsule (500 mg total) by mouth 3 (three) times daily. 04/08/21   Gwyneth Sprout, MD  cyclobenzaprine (FLEXERIL) 10 MG tablet Take 1 tablet (10 mg total) by mouth 2 (two) times daily as needed for muscle spasms. 11/07/20   Bing Neighbors, NP  diphenhydrAMINE (BENADRYL) 25 MG tablet Take 1 tablet (25 mg total) by mouth every 6 (six) hours as needed for up to 3 days. 01/14/22 01/17/22  Gloris Manchester, MD  EPINEPHrine 0.3 mg/0.3 mL IJ SOAJ injection Inject 0.3 mg into the muscle as needed for anaphylaxis. 01/14/22   Gloris Manchester, MD  famotidine (PEPCID) 20 MG tablet Take 1 tablet (20 mg total) by mouth 2 (two) times daily for 3 days. 01/14/22 01/17/22  Gloris Manchester, MD  meclizine (ANTIVERT) 25 MG tablet Take 1 tablet (25 mg total) by mouth 3 (three) times daily as needed for dizziness. 01/10/21   Arthor Captain, PA-C  naproxen (NAPROSYN)  375 MG tablet Take 1 tablet (375 mg total) by mouth 2 (two) times daily with a meal. 01/10/21   Arthor Captain, PA-C  tamsulosin (FLOMAX) 0.4 MG CAPS capsule Take 1 capsule (0.4 mg total) by mouth daily. 08/23/20   Wieters, Hallie C, PA-C      Allergies    Cheese and Amoxicillin    Review of Systems   Review of Systems  Physical Exam Updated Vital Signs BP (!) 135/102   Pulse 74   Temp 98.3 F (36.8 C) (Oral)   Resp 18   LMP 01/24/2023   SpO2 100%  Physical Exam Vitals and nursing note reviewed.  Constitutional:      General: She is not in acute distress.    Appearance: She is well-developed. She is not diaphoretic.  HENT:     Head: Normocephalic and atraumatic.  Eyes:     Pupils: Pupils are equal, round, and reactive to light.  Cardiovascular:     Rate and Rhythm: Normal rate and regular rhythm.     Heart sounds: No murmur heard.    No friction rub. No gallop.  Pulmonary:     Effort: Pulmonary effort is normal.     Breath sounds: No wheezing or rales.  Abdominal:     General: There is no distension.     Palpations: Abdomen is soft.  Tenderness: There is no abdominal tenderness.  Musculoskeletal:        General: No tenderness.     Cervical back: Normal range of motion and neck supple.     Comments: Some pain about the base of the right thumb without obvious deformity.  Full range of motion.  There is some ligamentous laxity at the base of the thumb.  No obvious snuffbox tenderness.  She has some pain along the right shoulder just below the [facility] joint.  No obvious pain along the clavicle or the [facility] joint or the scapula.  No obvious pain along the proximal humerus.  She has pain with range of motion of the shoulder.  She has diffuse weakness along the rotator cuff.  Skin:    General: Skin is warm and dry.  Neurological:     Mental Status: She is alert and oriented to person, place, and time.  Psychiatric:        Behavior: Behavior normal.     ED Results / Procedures  / Treatments   Labs (all labs ordered are listed, but only abnormal results are displayed) Labs Reviewed - No data to display  EKG None  Radiology DG Hand Complete Right  Result Date: 02/08/2023 CLINICAL DATA:  Right hand pain after a fall on Friday. EXAM: RIGHT HAND - COMPLETE 3+ VIEW COMPARISON:  None Available. FINDINGS: There is no evidence of fracture or dislocation. There is no evidence of arthropathy or other focal bone abnormality. Soft tissues are unremarkable. IMPRESSION: Negative. Electronically Signed   By: Burman Nieves M.D.   On: 02/08/2023 22:59   DG Shoulder Right  Result Date: 02/08/2023 CLINICAL DATA:  Right shoulder pain.  Patient fell on Friday. EXAM: RIGHT SHOULDER - 2+ VIEW COMPARISON:  01/08/2021 FINDINGS: There is no evidence of fracture or dislocation. There is no evidence of arthropathy or other focal bone abnormality. Soft tissues are unremarkable. IMPRESSION: Negative. Electronically Signed   By: Burman Nieves M.D.   On: 02/08/2023 22:58    Procedures Procedures    Medications Ordered in ED Medications - No data to display  ED Course/ Medical Decision Making/ A&P                             Medical Decision Making Amount and/or Complexity of Data Reviewed Radiology: ordered.   22 yo F with a chief complaints of fall from standing.  Complaining of right shoulder and right thumb pain.  Clinically there is some concern for gamekeeper's thumb on exam.  Will place in a removable thumb spica.  Patient has some shoulder pain.  Difficulty assessing the rotator cuff due to discomfort.  Plain film the right shoulder independently interpreted by me without fracture or dislocation.  Plain film of the right hand without obvious fracture or dislocation of the thumb.  11:21 PM:  I have discussed the diagnosis/risks/treatment options with the patient.  Evaluation and diagnostic testing in the emergency department does not suggest an emergent condition requiring  admission or immediate intervention beyond what has been performed at this time.  They will follow up with PCP, ortho. We also discussed returning to the ED immediately if new or worsening sx occur. We discussed the sx which are most concerning (e.g., sudden worsening pain, fever, inability to tolerate by mouth) that necessitate immediate return. Medications administered to the patient during their visit and any new prescriptions provided to the patient are listed below.  Medications given  during this visit Medications - No data to display   The patient appears reasonably screen and/or stabilized for discharge and I doubt any other medical condition or other [facility] requiring further screening, evaluation, or treatment in the ED at this time prior to discharge.          Final Clinical Impression(s) / ED Diagnoses Final diagnoses:  Thumb pain, right  Sprain of right shoulder, unspecified shoulder sprain type, initial encounter    Rx / DC Orders ED Discharge Orders     None         Melene Plan, DO 02/08/23 2321

## 2023-02-08 NOTE — ED Triage Notes (Signed)
Pt here from home with c/o right shoulder and hand pain after falling down a slope playing on Friday, has tried otc meds with minimal relief

## 2023-02-08 NOTE — Discharge Instructions (Signed)
Your x-rays do not show any obvious broken bones.  Please follow-up with orthopedics in the office.  Take 4 over the counter ibuprofen tablets 3 times a day or 2 over-the-counter naproxen tablets twice a day for pain. Also take tylenol 1000mg (2 extra strength) four times a day.

## 2023-06-02 ENCOUNTER — Other Ambulatory Visit: Payer: Self-pay

## 2023-06-02 ENCOUNTER — Encounter (HOSPITAL_BASED_OUTPATIENT_CLINIC_OR_DEPARTMENT_OTHER): Payer: Self-pay | Admitting: Emergency Medicine

## 2023-06-02 ENCOUNTER — Emergency Department (HOSPITAL_BASED_OUTPATIENT_CLINIC_OR_DEPARTMENT_OTHER): Payer: Self-pay | Admitting: Radiology

## 2023-06-02 ENCOUNTER — Emergency Department (HOSPITAL_BASED_OUTPATIENT_CLINIC_OR_DEPARTMENT_OTHER)
Admission: EM | Admit: 2023-06-02 | Discharge: 2023-06-02 | Disposition: A | Discharge status: Home / Self Care | Payer: Self-pay | Attending: Emergency Medicine | Admitting: Emergency Medicine

## 2023-06-02 DIAGNOSIS — S0990XA Unspecified injury of head, initial encounter: Secondary | ICD-10-CM | POA: Insufficient documentation

## 2023-06-02 DIAGNOSIS — Y990 Civilian activity done for income or pay: Secondary | ICD-10-CM | POA: Insufficient documentation

## 2023-06-02 DIAGNOSIS — W228XXA Striking against or struck by other objects, initial encounter: Secondary | ICD-10-CM | POA: Insufficient documentation

## 2023-06-02 MED ORDER — ACETAMINOPHEN 500 MG PO TABS
1000.0000 mg | ORAL_TABLET | Freq: Once | ORAL | Status: DC
  Filled 2023-06-02: qty 2

## 2023-06-02 NOTE — Discharge Instructions (Addendum)
Please take tylenol and motrin as instructed in the bottle for headaches.   Please follow up ENT, call Dr. Ulice Bold to schedule an appointment.
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
## 2023-06-28 ENCOUNTER — Encounter (HOSPITAL_BASED_OUTPATIENT_CLINIC_OR_DEPARTMENT_OTHER): Payer: Self-pay | Admitting: Emergency Medicine

## 2023-06-28 ENCOUNTER — Emergency Department (HOSPITAL_BASED_OUTPATIENT_CLINIC_OR_DEPARTMENT_OTHER)
Admission: EM | Admit: 2023-06-28 | Discharge: 2023-06-28 | Disposition: A | Discharge status: Home / Self Care | Payer: Self-pay | Attending: Emergency Medicine | Admitting: Emergency Medicine

## 2023-06-28 DIAGNOSIS — J45909 Unspecified asthma, uncomplicated: Secondary | ICD-10-CM | POA: Insufficient documentation

## 2023-06-28 DIAGNOSIS — G510 Bell's palsy: Secondary | ICD-10-CM | POA: Insufficient documentation

## 2023-06-28 DIAGNOSIS — G44209 Tension-type headache, unspecified, not intractable: Secondary | ICD-10-CM | POA: Insufficient documentation

## 2023-06-28 LAB — HCG, SERUM, QUALITATIVE: Preg, Serum: NEGATIVE

## 2023-06-28 MED ORDER — DIPHENHYDRAMINE HCL 50 MG/ML IJ SOLN
12.5000 mg | Freq: Once | INTRAMUSCULAR | Status: AC
  Administered 2023-06-28: 12.5 mg via INTRAVENOUS
  Filled 2023-06-28: qty 1

## 2023-06-28 MED ORDER — VALACYCLOVIR HCL 1 G PO TABS: 1000.0000 mg | ORAL_TABLET | Freq: Three times a day (TID) | ORAL | 0 refills | Status: AC

## 2023-06-28 MED ORDER — DEXAMETHASONE SODIUM PHOSPHATE 10 MG/ML IJ SOLN
10.0000 mg | Freq: Once | INTRAMUSCULAR | Status: AC
Start: 2023-06-28 — End: 2023-06-28
  Administered 2023-06-28: 10 mg via INTRAVENOUS
  Filled 2023-06-28: qty 1

## 2023-06-28 MED ORDER — PROCHLORPERAZINE EDISYLATE 10 MG/2ML IJ SOLN
10.0000 mg | Freq: Once | INTRAMUSCULAR | Status: AC
  Administered 2023-06-28: 10 mg via INTRAVENOUS
  Filled 2023-06-28: qty 2

## 2023-06-28 MED ORDER — PREDNISONE 10 MG PO TABS: ORAL_TABLET | ORAL | 0 refills | Status: AC

## 2023-06-28 NOTE — ED Triage Notes (Signed)
Headaceh, swelling in face and weakness. Started Friday.  Seen at [facility] and sent to r/o stroke vs bells palsy

## 2023-06-28 NOTE — ED Provider Notes (Signed)
[place] EMERGENCY DEPARTMENT AT [facility] Provider Note   CSN: [id number] Arrival date & time: 06/28/23  1528     History  Chief Complaint  Patient presents with   Headache    Rebekah Leon is a 22 y.o. female with history of seizures, asthma, presents with concern for facial weakness ongoing for 3 days.  She reports it is more difficult to drink out of a straw.  She was originally evaluated by urgent care, and referred here for further evaluation.  She also reports a headache across her forehead.  She reports a history of headaches and this is consistent with prior.  No changes in vision, vomiting, photophobia.  Headache was not sudden onset, no neck stiffness.    Headache      Home Medications Prior to Admission medications   Medication Sig Start Date End Date Taking? Authorizing Provider  predniSONE (DELTASONE) 10 MG tablet Take 6 tablets (60 mg total) by mouth daily with breakfast for 5 days, THEN 4 tablets (40 mg total) daily with breakfast for 1 day, THEN 3 tablets (30 mg total) daily with breakfast for 1 day, THEN 2 tablets (20 mg total) daily with breakfast for 1 day, THEN 1 tablet (10 mg total) daily with breakfast for 1 day. 06/28/23 07/07/23 Yes Arabella Merles, PA-C  valACYclovir (VALTREX) 1000 MG tablet Take 1 tablet (1,000 mg total) by mouth 3 (three) times daily for 7 days. 06/28/23 07/05/23 Yes Arabella Merles, PA-C  albuterol (VENTOLIN HFA) 108 (90 Base) MCG/ACT inhaler Inhale into the lungs every 6 (six) hours as needed for wheezing or shortness of breath.    [provider]  amoxicillin (AMOXIL) 500 MG capsule Take 1 capsule (500 mg total) by mouth 3 (three) times daily. 04/08/21   Gwyneth Sprout, MD  cyclobenzaprine (FLEXERIL) 10 MG tablet Take 1 tablet (10 mg total) by mouth 2 (two) times daily as needed for muscle spasms. 11/07/20   Bing Neighbors, NP  diphenhydrAMINE (BENADRYL) 25 MG tablet Take 1 tablet (25 mg total) by mouth  every 6 (six) hours as needed for up to 3 days. 01/14/22 01/17/22  Gloris Manchester, MD  EPINEPHrine 0.3 mg/0.3 mL IJ SOAJ injection Inject 0.3 mg into the muscle as needed for anaphylaxis. 01/14/22   Gloris Manchester, MD  famotidine (PEPCID) 20 MG tablet Take 1 tablet (20 mg total) by mouth 2 (two) times daily for 3 days. 01/14/22 01/17/22  Gloris Manchester, MD  meclizine (ANTIVERT) 25 MG tablet Take 1 tablet (25 mg total) by mouth 3 (three) times daily as needed for dizziness. 01/10/21   Arthor Captain, PA-C  naproxen (NAPROSYN) 375 MG tablet Take 1 tablet (375 mg total) by mouth 2 (two) times daily with a meal. 01/10/21   Arthor Captain, PA-C  tamsulosin (FLOMAX) 0.4 MG CAPS capsule Take 1 capsule (0.4 mg total) by mouth daily. 08/23/20   Wieters, Hallie C, PA-C      Allergies    Cheese and Amoxicillin    Review of Systems   Review of Systems  Neurological:  Positive for headaches.    Physical Exam Updated Vital Signs BP (!) 126/95   Pulse 86   Resp 16   LMP 06/01/2023   SpO2 100%  Physical Exam Vitals and nursing note reviewed.  Constitutional:      General: She is not in acute distress.    Appearance: She is well-developed.  HENT:     Head: Normocephalic and atraumatic.  Eyes:     Extraocular  Movements: Extraocular movements intact.     Conjunctiva/sclera: Conjunctivae normal.     Pupils: Pupils are equal, round, and reactive to light.  Neck:     Comments: No nuchal rigidity Cardiovascular:     Rate and Rhythm: Normal rate and regular rhythm.     Heart sounds: No murmur heard. Pulmonary:     Effort: Pulmonary effort is normal. No respiratory distress.     Breath sounds: Normal breath sounds.  Abdominal:     Palpations: Abdomen is soft.     Tenderness: There is no abdominal tenderness.  Musculoskeletal:        General: No swelling.     Cervical back: Neck supple.  Skin:    General: Skin is warm and dry.     Capillary Refill: Capillary refill takes less than 2 seconds.  Neurological:      Mental Status: She is alert.     Comments: Unable to fully smile, puff out cheek, or raise right eyebrow on right side of face, able to perform these on the left.  Unable to close eye fully on the right  Intact sensation in V1, V2, V3 distribution bilaterally  Extraocular movements intact, pupils equal round reactive to light.  No drift with arm raise bilaterally, no drift with leg raise bilaterally   5/5 strength in the upper and lower extremities bilaterally  Psychiatric:        Mood and Affect: Mood normal.     ED Results / Procedures / Treatments   Labs (all labs ordered are listed, but only abnormal results are displayed) Labs Reviewed  HCG, SERUM, QUALITATIVE  PREGNANCY, URINE    EKG None  Radiology No results found.  Procedures Procedures    Medications Ordered in ED Medications  prochlorperazine (COMPAZINE) injection 10 mg (10 mg Intravenous Given 06/28/23 1649)  diphenhydrAMINE (BENADRYL) injection 12.5 mg (12.5 mg Intravenous Given 06/28/23 1649)  dexamethasone (DECADRON) injection 10 mg (10 mg Intravenous Given 06/28/23 1648)    ED Course/ Medical Decision Making/ A&P Clinical Course as of 06/28/23 1737  Sun Jun 28, 2023  1559 Stable HA and  [CC]    Clinical Course User Index [CC] Glyn Ade, MD                                 Medical Decision Making Amount and/or Complexity of Data Reviewed Labs: ordered.  Risk Prescription drug management.   22 y.o. female with pertinent past medical history of asthma, seizures presents to the ED for concern of facial weakness for the past 3 days, headache on and off  Differential diagnosis includes but is not limited to stroke, TIA,  Bell's Palsy, migraine, tension headache, complex migraine  ED Course:  Overall well-appearing, vital signs stable.  She reports feeling generally more weak for the past 3 days and reports having more difficulty drinking out of a straw.  On exam, right sided facial  weakness with smiling, puffing cheeks out, eyebrow raise.  Unable to close eye fully on the right.  Dr. Doran Durand also evaluated patient, feel this is consistent with Bell's palsy.  Will treat with 10 mg dexamethasone here, and discharged on prednisone taper as well as 7 day course of valacyclovir. Patient also reports a headache in the front of her head.  States this was gradual in onset, consistent with prior headaches.  She does have a history of headaches which she usually takes ibuprofen for.  No associated photophobia, changes in vision, nuchal rigidity.  Will treat headache with IV Compazine and Benadryl here. Upon re-evaluation, patient reports her headache has improved.  Appropriate for discharge home at this time  Impression: Bell's palsy Tension headache  Disposition:  The patient was discharged home with instructions to follow-up with neurology within the next week.  Contact information for their office provided.  Take valacyclovir and prednisone as prescribed.  Ibuprofen and Tylenol as needed for headache. Return precautions given.  Lab Tests: I Ordered, and personally interpreted labs.  The pertinent results include:   Pregnancy negative           Final Clinical Impression(s) / ED Diagnoses Final diagnoses:  Bell's palsy  Tension headache    Rx / DC Orders ED Discharge Orders          Ordered    valACYclovir (VALTREX) 1000 MG tablet  3 times daily        06/28/23 1715    predniSONE (DELTASONE) 10 MG tablet  Q breakfast        06/28/23 1715              Arabella Merles, PA-C 06/28/23 1737    Glyn Ade, MD 06/28/23 2021

## 2023-06-28 NOTE — Discharge Instructions (Addendum)
You have Bell's Palsy. Bell's palsy is a short-term inability to move muscles in a part of the face. The inability to move, also called paralysis, results from inflammation or compression of the seventh cranial nerve. This nerve is responsible for facial movements that include blinking, closing the eyes, smiling, and frowning.  When sleeping, please wake up every 4 hours and please apply artificial eyedrops/ lubricant eye drops to your eye.  These are over-the-counter he can obtain at any drugstore.  You may also keep your eyes closed to help prevent them from drying out.  Please call the neurology office listed below to schedule an appointment within the next week to follow-up regarding your Bell's palsy.  You have been prescribed a antiviral called valacyclovir (Valtrex).  Please take this 3 times a day for the next 7 days.  You have also been prescribed a steroid called prednisone.  Please take this in the taper format as prescribed.  This medication can make you feel more awake.  I recommend you take it first thing in the morning with breakfast to prevent keeping him up at night.  If you have elevated blood sugars or known diabetes, please continue to monitor your blood sugars as this medication can cause your blood sugars to rise more than normal.  You may take up to 1000mg  of tylenol every 6 hours as needed for pain/headache.  Do not take more then 4g per day.  You may use up to 800mg  ibuprofen every 8 hours as needed for pain/headache.  Do not exceed 2.4g of ibuprofen per day.   Return to the ER for any changes in vision, vomiting, unexplained fever, any other new or concerning symptoms.

## 2024-04-06 ENCOUNTER — Emergency Department (HOSPITAL_BASED_OUTPATIENT_CLINIC_OR_DEPARTMENT_OTHER)
Admission: EM | Admit: 2024-04-06 | Discharge: 2024-04-06 | Disposition: A | Discharge status: Home / Self Care | Source: Ambulatory Visit | Attending: Emergency Medicine | Admitting: Emergency Medicine

## 2024-04-06 ENCOUNTER — Emergency Department (HOSPITAL_BASED_OUTPATIENT_CLINIC_OR_DEPARTMENT_OTHER): Admitting: Radiology

## 2024-04-06 ENCOUNTER — Encounter (HOSPITAL_BASED_OUTPATIENT_CLINIC_OR_DEPARTMENT_OTHER): Payer: Self-pay

## 2024-04-06 ENCOUNTER — Other Ambulatory Visit: Payer: Self-pay

## 2024-04-06 DIAGNOSIS — N920 Excessive and frequent menstruation with regular cycle: Secondary | ICD-10-CM

## 2024-04-06 DIAGNOSIS — R0781 Pleurodynia: Secondary | ICD-10-CM | POA: Insufficient documentation

## 2024-04-06 DIAGNOSIS — N939 Abnormal uterine and vaginal bleeding, unspecified: Secondary | ICD-10-CM | POA: Insufficient documentation

## 2024-04-06 DIAGNOSIS — R112 Nausea with vomiting, unspecified: Secondary | ICD-10-CM | POA: Diagnosis not present

## 2024-04-06 DIAGNOSIS — J45909 Unspecified asthma, uncomplicated: Secondary | ICD-10-CM | POA: Insufficient documentation

## 2024-04-06 LAB — URINALYSIS, ROUTINE W REFLEX MICROSCOPIC
Bilirubin Urine: NEGATIVE
Glucose, UA: NEGATIVE mg/dL
Hgb urine dipstick: NEGATIVE
Ketones, ur: NEGATIVE mg/dL
Leukocytes,Ua: NEGATIVE
Nitrite: NEGATIVE
Specific Gravity, Urine: 1.022 (ref 1.005–1.030)
pH: 5.5 (ref 5.0–8.0)

## 2024-04-06 LAB — CBC WITH DIFFERENTIAL/PLATELET
Abs Immature Granulocytes: 0.02 K/uL (ref 0.00–0.07)
Basophils Absolute: 0 K/uL (ref 0.0–0.1)
Basophils Relative: 0 %
Eosinophils Absolute: 0.1 K/uL (ref 0.0–0.5)
Eosinophils Relative: 1 %
HCT: 43.8 % (ref 36.0–46.0)
Hemoglobin: 13.7 g/dL (ref 12.0–15.0)
Immature Granulocytes: 0 %
Lymphocytes Relative: 31 %
Lymphs Abs: 3.1 K/uL (ref 0.7–4.0)
MCH: 26.2 pg (ref 26.0–34.0)
MCHC: 31.3 g/dL (ref 30.0–36.0)
MCV: 83.9 fL (ref 80.0–100.0)
Monocytes Absolute: 0.5 K/uL (ref 0.1–1.0)
Monocytes Relative: 5 %
Neutro Abs: 6.3 K/uL (ref 1.7–7.7)
Neutrophils Relative %: 63 %
Platelets: 311 K/uL (ref 150–400)
RBC: 5.22 MIL/uL — ABNORMAL HIGH (ref 3.87–5.11)
RDW: 14.1 % (ref 11.5–15.5)
WBC: 10.1 K/uL (ref 4.0–10.5)
nRBC: 0 % (ref 0.0–0.2)

## 2024-04-06 LAB — PREGNANCY, URINE: Preg Test, Ur: NEGATIVE

## 2024-04-06 LAB — COMPREHENSIVE METABOLIC PANEL WITH GFR
ALT: 15 U/L (ref 0–44)
AST: 21 U/L (ref 15–41)
Albumin: 4.3 g/dL (ref 3.5–5.0)
Alkaline Phosphatase: 117 U/L (ref 38–126)
Anion gap: 13 (ref 5–15)
BUN: 12 mg/dL (ref 6–20)
CO2: 25 mmol/L (ref 22–32)
Calcium: 10.2 mg/dL (ref 8.9–10.3)
Chloride: 101 mmol/L (ref 98–111)
Creatinine, Ser: 0.91 mg/dL (ref 0.44–1.00)
GFR, Estimated: >60 mL/min (ref >60)
Glucose, Bld: 90 mg/dL (ref 70–99)
Potassium: 4.4 mmol/L (ref 3.5–5.1)
Sodium: 138 mmol/L (ref 135–145)
Total Bilirubin: 0.4 mg/dL (ref 0.0–1.2)
Total Protein: 8.5 g/dL — ABNORMAL HIGH (ref 6.5–8.1)

## 2024-04-06 LAB — LIPASE, BLOOD: Lipase: 31 U/L (ref 11–51)

## 2024-04-06 MED ORDER — ONDANSETRON HCL 4 MG PO TABS: 4.0000 mg | ORAL_TABLET | Freq: Three times a day (TID) | ORAL | Status: AC | PRN

## 2024-04-06 MED ORDER — NAPROXEN 500 MG PO TABS
500.0000 mg | ORAL_TABLET | Freq: Two times a day (BID) | ORAL | 0 refills | Status: AC
Start: 2024-04-06 — End: ?

## 2024-04-06 MED ORDER — ONDANSETRON HCL 4 MG PO TABS
4.0000 mg | ORAL_TABLET | Freq: Once | ORAL | Status: AC
  Administered 2024-04-06: 4 mg via ORAL
  Filled 2024-04-06: qty 1

## 2024-04-06 NOTE — ED Triage Notes (Signed)
 Patient has has left sided flank pain on and off for months but then reports vaginal bleeding starting today. She reports it ended on 7/13 this month. She denies pregnancy. She denies urinary symptoms. Does report history of kidney stones supposedly.

## 2024-04-06 NOTE — Discharge Instructions (Addendum)
 You were seen today for her left-sided rib pain as well as some spotting that you noticed today.  Your lab work imaging and physical exam were all really reassuring that I have low suspicion for any emergent causes of your symptoms today.  However recommend you continue to follow-up with your PCP for continued evaluation for this pain as well as for the vaginal spotting.  I am prescribing anti-inflammatory for you to use as needed for the left-sided rib pain.  However you can also use Tylenol  as well.  And also sending you home with an antinausea medication to use as needed.  Please take Naprosyn , 500mg  by mouth twice daily as needed for pain - this in an antiinflammatory medicine (NSAID) and is similar to ibuprofen  - many people feel that it is stronger than ibuprofen  and it is easier to take since it is a smaller pill.  Please use this only for 1 week - if your pain persists, you will need to follow up with your doctor in the office for ongoing guidance and pain control.    Return to the emergency department if you have any new or worsening symptoms or should include worsening or uncontrolled chest pain/shortness of breath worse on exertion, fever, blood in urine, blood in stool, persistent vomiting despite nausea medication.

## 2024-04-06 NOTE — ED Notes (Signed)
 Patient transported to X-ray

## 2024-04-06 NOTE — ED Provider Notes (Signed)
 [place] EMERGENCY DEPARTMENT AT [facility] Provider Note   CSN: [id number] Arrival date & time: 04/06/24  1327     Patient presents with: Vaginal Bleeding and Flank Pain   Rebekah Leon is a 23 y.o. female.  Vaginal Bleeding Flank Pain  Patient is a 23 year old female to the ED today for concerns for a streak of blood that she noticed on her toilet paper from her vaginal area earlier today x 1 episode as well as chronic left-sided rib pain that is been present for many months at this time but has had accompanying nausea and vomiting x 2 episodes, nonbilious, nonbloody.  Has had a few sips of water since then and has tolerated.  States that she has had 1 episode of chills 2 days ago.  Currently only complaining of left-sided rib pain and nausea.  Notes that the left-sided rib pain has been evaluated by many providers at this time.  With no answers at this time.  Not taking birth control.  Currently in relationship with another woman, with no other sexual interactions.  Denies fever, headache, vision changes, shortness of breath, hemoptysis, trauma to the area, rashes, abdominal pain, lower leg swelling, dysuria, hematuria, vaginal discharge, vaginal pain, vaginal itching, rectal pain, hematochezia, melena, diarrhea.     Prior to Admission medications   Medication Sig Start Date End Date Taking? Authorizing Provider  naproxen  (NAPROSYN ) 500 MG tablet Take 1 tablet (500 mg total) by mouth 2 (two) times daily. 04/06/24  Yes Shalayah Beagley S, PA-C  ondansetron  (ZOFRAN ) 4 MG tablet Take 1 tablet (4 mg total) by mouth every 8 (eight) hours as needed for nausea or vomiting. 04/06/24  Yes Kristyne Woodring S, PA-C  albuterol  (VENTOLIN  HFA) 108 (90 Base) MCG/ACT inhaler Inhale into the lungs every 6 (six) hours as needed for wheezing or shortness of breath.    [provider]  amoxicillin  (AMOXIL ) 500 MG capsule Take 1 capsule (500 mg total) by mouth 3 (three) times daily.  04/08/21   Doretha Folks, MD  cyclobenzaprine  (FLEXERIL ) 10 MG tablet Take 1 tablet (10 mg total) by mouth 2 (two) times daily as needed for muscle spasms. 11/07/20   Arloa Suzen RAMAN, NP  diphenhydrAMINE  (BENADRYL ) 25 MG tablet Take 1 tablet (25 mg total) by mouth every 6 (six) hours as needed for up to 3 days. 01/14/22 01/17/22  Melvenia Motto, MD  EPINEPHrine  0.3 mg/0.3 mL IJ SOAJ injection Inject 0.3 mg into the muscle as needed for anaphylaxis. 01/14/22   Melvenia Motto, MD  famotidine  (PEPCID ) 20 MG tablet Take 1 tablet (20 mg total) by mouth 2 (two) times daily for 3 days. 01/14/22 01/17/22  Melvenia Motto, MD  meclizine  (ANTIVERT ) 25 MG tablet Take 1 tablet (25 mg total) by mouth 3 (three) times daily as needed for dizziness. 01/10/21   Harris, Abigail, PA-C  tamsulosin  (FLOMAX ) 0.4 MG CAPS capsule Take 1 capsule (0.4 mg total) by mouth daily. 08/23/20   Wieters, Hallie C, PA-C    Allergies: Cheese and Amoxicillin     Review of Systems  Genitourinary:  Positive for flank pain and vaginal bleeding.  All other systems reviewed and are negative.   Updated Vital Signs BP (!) 151/106   Pulse 70   Temp 98.3 F (36.8 C) (Oral)   Resp 14   Ht 5' 7.5 (1.715 m)   Wt 111.1 kg   SpO2 100%   BMI 37.81 kg/m   Physical Exam Vitals and nursing note reviewed.  Constitutional:  General: She is not in acute distress.    Appearance: Normal appearance. She is not ill-appearing or diaphoretic.  HENT:     Head: Normocephalic and atraumatic.  Eyes:     General: No scleral icterus.       Right eye: No discharge.        Left eye: No discharge.     Extraocular Movements: Extraocular movements intact.     Conjunctiva/sclera: Conjunctivae normal.  Cardiovascular:     Rate and Rhythm: Normal rate and regular rhythm.     Pulses: Normal pulses.     Heart sounds: Normal heart sounds. No murmur heard.    No friction rub. No gallop.  Pulmonary:     Effort: Pulmonary effort is normal. No respiratory distress.      Breath sounds: No stridor. No wheezing, rhonchi or rales.  Chest:     Chest wall: Tenderness (Noted to have left-sided chest pain over the anterior axillary line, no erythema or ecchymosis or signs of trauma or infection) present.  Abdominal:     General: Abdomen is flat. There is no distension.     Palpations: Abdomen is soft.     Tenderness: There is no abdominal tenderness. There is no right CVA tenderness, left CVA tenderness, guarding or rebound.  Musculoskeletal:        General: No swelling, deformity or signs of injury.     Cervical back: Normal range of motion. No rigidity.     Right lower leg: No edema.     Left lower leg: No edema.  Skin:    General: Skin is warm and dry.     Findings: No bruising, erythema or lesion.  Neurological:     General: No focal deficit present.     Mental Status: She is alert and oriented to person, place, and time. Mental status is at baseline.     Sensory: No sensory deficit.     Motor: No weakness.  Psychiatric:        Mood and Affect: Mood normal.     (all labs ordered are listed, but only abnormal results are displayed) Labs Reviewed  URINALYSIS, ROUTINE W REFLEX MICROSCOPIC - Abnormal; Notable for the following components:      Result Value   Protein, ur TRACE (*)    All other components within normal limits  CBC WITH DIFFERENTIAL/PLATELET - Abnormal; Notable for the following components:   RBC 5.22 (*)    All other components within normal limits  COMPREHENSIVE METABOLIC PANEL WITH GFR - Abnormal; Notable for the following components:   Total Protein 8.5 (*)    All other components within normal limits  PREGNANCY, URINE  LIPASE, BLOOD    EKG: EKG Interpretation Date/Time:  Wednesday April 06 2024 17:57:05 EDT Ventricular Rate:  75 PR Interval:  179 QRS Duration:  75 QT Interval:  354 QTC Calculation: 396 R Axis:   65  Text Interpretation: Sinus rhythm Nonspecific T abnormalities, inferior leads T wave inversions more  pronounced than 2023/07/28 Confirmed by Gennaro Bouchard ([id number]) on 04/06/2024 6:20:43 PM  Radiology: [facility] Ribs Unilateral W/Chest Left Result Date: 04/06/2024 CLINICAL DATA:  Left-sided rib pain for several months. EXAM: LEFT RIBS AND CHEST - 3+ VIEW COMPARISON:  April 28, 2022. FINDINGS: No fracture or other bone lesions are seen involving the ribs. There is no evidence of pneumothorax or pleural effusion. Both lungs are clear. Heart size and mediastinal contours are within normal limits. IMPRESSION: Negative. Electronically Signed   By: Lynwood  Landy Raddle M.D.   On: 04/06/2024 17:56    Procedures   Medications Ordered in the ED  ondansetron  (ZOFRAN ) tablet 4 mg (4 mg Oral Given 04/06/24 1743)                             PERC Score: 0, PERC Score Interpretation: No need for further workup, as <2% chance of PE.  If no criteria are positive and clinicians pre-test probability is <15%, PERC Rule criteria are satisfied Medical Decision Making Amount and/or Complexity of Data Reviewed Labs: ordered.   This patient is a 23 year old female who presents to the ED for concern of vaginal spotting x 1 episode that happened today that she noticed on 12 April when wiping as well as chronic left-sided rib pain over the anterior axillary line that has been worked up by several riders already with unknown cause.   On physical exam, patient is in no acute distress, afebrile, alert and orient x 4, speaking in full sentences, nontachypneic, nontachycardic.  Notably tender to left sided ribs over the anterior axillary line with no signs of ecchymosis, infectious signs, crepitus.  LCTAB, RRR, no murmur, no CVA tenderness, no abdominal tenderness to palpation, no lower leg edema.  Unremarkable exam otherwise.  Low suspicion for any emergent cause of patient symptoms.  With patient only having 1 episode of spotting, had shared decision making and patient deferred on GU exam.  Labs and imaging were unremarkable.  Provided  Zofran  for nausea.  On reevaluation, patient states her nausea has abated.  No other symptoms at this time.  Fianc who is at bedside reports that she gets this way when she is more anxious or under stress.  With all the labs and imaging at this time and reassuring physical exam, low suspicion for any emergent cause of patient symptoms.  Suspect most likely musculoskeletal cause of symptoms today.  And will have routine follow-up with PCP for chronic pain as well as for the spotting that she noticed today without any repeat episodes of bleeding.  Patient vital signs have remained stable throughout the course of patient's time in the ED. Low suspicion for any other emergent pathology at this time. I believe this patient is safe to be discharged. Provided strict return to ER precautions. Patient expressed agreement and understanding of plan. All questions were answered.   Differential diagnoses prior to evaluation: The emergent differential diagnosis includes, but is not limited to, ACS, AAS, Pulmonary Embolism, Tension Pneumothorax, Esophageal Rupture, Cardiac Tamponade, Pericarditis, Myocarditis, Pneumothorax, Pneumonia, Aortic Stenosis, CHF Exacerbation, GERD,  Esophageal Spasm,  Mallory-Weiss, Costochondritis, Musculoskeletal Chest Wall Pain, Anxiety / Panic Attack, ovulation bleeding, UTI, STI, diverticulitis, appendicitis, cholecystitis, gastroenteritis. This is not an exhaustive differential.   Past Medical History / Co-morbidities / Social History: Asthma, seizures  Additional history: Chart reviewed. Pertinent results include:   Had CT of abdomen done on 11/09/2023 showing some possible cyst to the right hepatic lobe without any other acute findings.  Lab Tests/Imaging studies: I personally interpreted labs/imaging and the pertinent results include:   CBC unremarkable CMP unremarkable Urine unremarkable Pregnancy is negative Lipase unremarkable DG ribs with left chest unremarkable I  agree with the radiologist interpretation.  Cardiac monitoring: EKG obtained and interpreted by myself and attending physician which shows: Sinus rhythm with nonspecific T wave abnormalities  EKG Interpretation Date/Time:  Wednesday April 06 2024 17:57:05 EDT Ventricular Rate:  75 PR Interval:  179 QRS Duration:  75 QT Interval:  354 QTC Calculation: 396 R Axis:   65  Text Interpretation: Sinus rhythm Nonspecific T abnormalities, inferior leads T wave inversions more pronounced than 2023-07-08 Confirmed by Kammerer, Megan ([id number]) on 04/06/2024 6:20:43 PM          Medications: I ordered medication including Zofran .  I have reviewed the patients home medicines and have made adjustments as needed.  Critical Interventions: None  Social Determinants of Health: Has good follow-up with PCP  Disposition: After consideration of the diagnostic results and the patients response to treatment, I feel that the patient would benefit from discharge and treatment as above  emergency department workup does not suggest an emergent condition requiring admission or immediate intervention beyond what has been performed at this time. The plan is: Follow-up with PCP as needed, naproxen  for pain, Zofran  for nausea, return to the ED for any new or worsening symptoms. The patient is safe for discharge and has been instructed to return immediately for worsening   Final diagnoses:  Rib pain on left side  Spotting    ED Discharge Orders          Ordered    ondansetron  (ZOFRAN ) 4 MG tablet  Every 8 hours PRN        04/06/24 1911    naproxen  (NAPROSYN ) 500 MG tablet  2 times daily        04/06/24 1911               Beola Terrall RAMAN, PA-C 04/06/24 1914    Gennaro, Megan L, DO 04/07/24 1823

## 2024-04-06 NOTE — ED Notes (Signed)
 Attempted to ambulate pt in hall. Current HR 98, O2 76- pt placed on 2L o2, RR 21. Current HR 79, 02 100 2L [place] lying in bed
